# Patient Record
Sex: Male | Born: 1985 | Race: White | Hispanic: No | Marital: Married | Smoking: Current every day smoker
Health system: Southern US, Community
[De-identification: ages and names within clinical notes are randomized; demographics above are authoritative.]

## PROBLEM LIST (undated history)

## (undated) DIAGNOSIS — F32A Depression, unspecified: Secondary | ICD-10-CM

## (undated) DIAGNOSIS — F329 Major depressive disorder, single episode, unspecified: Secondary | ICD-10-CM

## (undated) DIAGNOSIS — R7989 Other specified abnormal findings of blood chemistry: Secondary | ICD-10-CM

## (undated) HISTORY — DX: Depression, unspecified: F32.A

## (undated) HISTORY — DX: Other specified abnormal findings of blood chemistry: R79.89

## (undated) HISTORY — DX: Major depressive disorder, single episode, unspecified: F32.9

## (undated) HISTORY — PX: TONSILLECTOMY AND ADENOIDECTOMY: SUR1326

---
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
## 2005-07-28 ENCOUNTER — Emergency Department: Admit: 2005-07-28 | Payer: Self-pay | Source: Emergency Department | Admitting: Emergency Medicine

## 2005-11-04 HISTORY — PX: TONSILLECTOMY: SUR1361

## 2005-11-25 ENCOUNTER — Ambulatory Visit
Admission: RE | Admit: 2005-11-25 | Disposition: A | Discharge status: Home / Self Care | Payer: Self-pay | Source: Ambulatory Visit

## 2007-07-22 ENCOUNTER — Emergency Department
Admission: EM | Admit: 2007-07-22 | Disposition: A | Discharge status: Home / Self Care | Payer: Self-pay | Source: Emergency Department | Admitting: Emergency Medicine

## 2007-08-12 ENCOUNTER — Ambulatory Visit
Admission: RE | Admit: 2007-08-12 | Disposition: A | Discharge status: Home / Self Care | Payer: Self-pay | Source: Ambulatory Visit | Admitting: Family

## 2008-03-12 ENCOUNTER — Emergency Department
Admission: EM | Admit: 2008-03-12 | Disposition: A | Discharge status: Other Acute Inpatient Hospital | Payer: Self-pay | Source: Emergency Department | Admitting: Emergency Medicine

## 2008-03-13 ENCOUNTER — Inpatient Hospital Stay
Admission: EM | Admit: 2008-03-13 | Disposition: A | Discharge status: Home / Self Care | Payer: Self-pay | Source: Emergency Department | Admitting: Otolaryngology

## 2008-03-13 LAB — MAGNESIUM: Magnesium: 1.6 mg/dL (ref 1.6–2.3)

## 2008-03-13 LAB — BASIC METABOLIC PANEL
BUN: 16 mg/dL (ref 8–20)
BUN: 19 mg/dL (ref 8–20)
CO2: 22 mEq/L (ref 21–30)
CO2: 24 mEq/L (ref 21–30)
Calcium: 8.3 mg/dL — ABNORMAL LOW (ref 8.6–10.2)
Calcium: 9 mg/dL (ref 8.6–10.2)
Chloride: 107 mEq/L (ref 98–107)
Chloride: 108 mEq/L — ABNORMAL HIGH (ref 98–107)
Creatinine: 0.9 mg/dL (ref 0.6–1.5)
Creatinine: 0.9 mg/dL (ref 0.6–1.5)
Glucose: 123 mg/dL — ABNORMAL HIGH (ref 70–100)
Glucose: 96 mg/dL (ref 70–100)
Potassium: 4.4 mEq/L (ref 3.6–5.0)
Potassium: 5.3 mEq/L — ABNORMAL HIGH (ref 3.6–5.0)
Sodium: 138 mEq/L (ref 136–146)
Sodium: 139 mEq/L (ref 136–146)

## 2008-03-13 LAB — CBC AND DIFFERENTIAL
Basophils Absolute: 0 /mm3 (ref 0.0–0.2)
Basophils: 0 % (ref 0–2)
Eosinophils Absolute: 0.2 /mm3 (ref 0.0–0.7)
Eosinophils: 1 % (ref 0–5)
Granulocytes Absolute: 12.2 /mm3 — ABNORMAL HIGH (ref 1.8–8.1)
Hematocrit: 36 % — ABNORMAL LOW (ref 42.0–52.0)
Hgb: 12.5 G/DL — ABNORMAL LOW (ref 13.0–17.0)
Immature Granulocytes Absolute: 0.1
Immature Granulocytes: 0 %
Lymphocytes Absolute: 3.2 /mm3 (ref 0.5–4.4)
Lymphocytes: 19 % (ref 15–41)
MCH: 30.4 PG (ref 28.0–32.0)
MCHC: 34.7 G/DL (ref 32.0–36.0)
MCV: 87.6 FL (ref 80.0–100.0)
MPV: 9.2 FL — ABNORMAL LOW (ref 9.4–12.3)
Monocytes Absolute: 1.6 /mm3 — ABNORMAL HIGH (ref 0.0–1.2)
Monocytes: 9 % (ref 0–11)
Neutrophils %: 71 % (ref 52–75)
Platelets: 324 /mm3 (ref 140–400)
RBC: 4.11 /mm3 — ABNORMAL LOW (ref 4.70–6.00)
RDW: 12.5 % (ref 11.5–15.0)
WBC: 17.25 /mm3 — ABNORMAL HIGH (ref 3.50–10.80)

## 2008-03-13 LAB — CBC
Hematocrit: 31.9 % — ABNORMAL LOW (ref 42.0–52.0)
Hgb: 10.9 G/DL — ABNORMAL LOW (ref 13.0–17.0)
MCH: 28.8 PG (ref 28.0–32.0)
MCHC: 34.2 G/DL (ref 32.0–36.0)
MCV: 84.2 FL (ref 80.0–100.0)
MPV: 8.9 FL — ABNORMAL LOW (ref 9.4–12.3)
Platelets: 246 /mm3 (ref 140–400)
RBC: 3.79 /mm3 — ABNORMAL LOW (ref 4.70–6.00)
RDW: 17.4 % — ABNORMAL HIGH (ref 11.5–15.0)
WBC: 10.97 /mm3 — ABNORMAL HIGH (ref 3.50–10.80)

## 2008-03-13 LAB — TYPE AND SCREEN
AB Screen Gel: NEGATIVE
ABO Rh: O POS

## 2008-03-13 LAB — GFR

## 2008-03-13 LAB — PHOSPHORUS: Phosphorus: 2.2 mg/dL — ABNORMAL LOW (ref 2.5–4.5)

## 2008-03-14 LAB — BASIC METABOLIC PANEL
BUN / Creatinine Ratio: 13 (ref 8–20)
BUN: 13 mg/dL (ref 6–20)
CO2: 29 mmol/L (ref 21.0–31.0)
Calcium: 9.5 mg/dL (ref 8.4–10.2)
Chloride: 101 mmol/L (ref 101–111)
Creatinine: 1.02 mg/dL (ref 0.5–1.4)
EGFR: >60 mL/min/{1.73_m2}
EGFR: >60 mL/min/{1.73_m2}
Glucose: 102 mg/dL — ABNORMAL HIGH (ref 70–100)
Potassium: 4.5 mmol/L (ref 3.6–5.0)
Sodium: 143 mmol/L (ref 135–145)

## 2008-03-14 LAB — ^CBC WITH DIFF MCKESSON
Hematocrit: 45.2 % (ref 39.0–49.0)
Hemoglobin: 15.7 g/dL (ref 13.2–17.3)
MCH: 30.4 pg (ref 27.0–34.0)
MCHC: 34.8 % (ref 32.0–36.0)
MCV: 87.5 fL (ref 80–100)
Platelets: 433 10*3/uL — ABNORMAL HIGH (ref 150–400)
RBC: 5.17 /mm3 (ref 3.80–5.40)
RDW: 12.1 % (ref 11.0–14.0)
WBC: 15.5 10*3/uL — ABNORMAL HIGH (ref 4.8–10.8)

## 2008-03-14 LAB — PTT (PARTIAL THROMBOPLASTIN TIME)
PTT Ratio: 1.1 (ref 0.8–1.2)
PTT: 31.4 s (ref 21.6–34.0)

## 2008-03-14 LAB — PT (PROTHROMBIN TIME)
PT INR: 1.2
PT: 13.1 s — ABNORMAL HIGH (ref 10.6–12.8)

## 2008-03-14 LAB — ^MANUAL DIFFERENTIAL MCKESSON
CELLS COUNTED: 100
Eosinophils Manual: 1 % (ref 0–6)
Lymphocytes Manual: 29 % (ref 25–55)
Monocytes: 10 % — ABNORMAL HIGH (ref 1–8)
Platelet Evaluation: NORMAL
RBC Morphology: NORMAL
Segmented Neutrophils: 60 % (ref 49–69)

## 2008-03-14 LAB — ABO/RH: ABO Rh: O POS

## 2008-03-14 LAB — ANTIBODY SCREEN: AB Screen Gel: NEGATIVE

## 2010-09-20 ENCOUNTER — Emergency Department: Payer: Self-pay | Admitting: Emergency Medicine

## 2010-10-10 ENCOUNTER — Emergency Department: Payer: Self-pay | Admitting: Emergency Medicine

## 2010-11-04 HISTORY — PX: ADENOIDECTOMY: SUR15

## 2011-03-11 ENCOUNTER — Emergency Department
Admission: EM | Admit: 2011-03-11 | Disposition: A | Discharge status: Home / Self Care | Payer: Self-pay | Source: Emergency Department | Admitting: Emergency Medicine

## 2011-08-23 NOTE — Op Note (Unsigned)
DATE OF BIRTH:                        13-May-1986      ADMISSION DATE:                     03/13/2008            PATIENT LOCATION:                    [id number]            DATE OF PROCEDURE:                   03/13/2008      SURGEON:                            Pauline Aus, MD      ASSISTANT(S):                  PREOPERATIVE DIAGNOSIS:  POSTOPERATIVE TONSILLECTOMY BLEEDING.            POSTOPERATIVE DIAGNOSIS:  POSTOPERATIVE TONSILLECTOMY BLEEDING, RIGHT      INFERIOR TONSILLAR POLE.            PROCEDURE:  EMERGENCY EXAMINATION UNDER ANESTHESIA WITH CONTROL OF      BLEEDING, RIGHT TONSILLAR FOSSA, INFERIOR POLE.            ANESTHESIA:  General endotracheal.            INDICATIONS FOR PROCEDURE:  This is a 25 year old male with a tonsillectomy      performed by another operator within the past 24 hours.  The patient was      discharged home after uneventful surgery and did well until today when he      increased activity.  After eating a hamburger and [place] fries from      [employer], he began to have profuse intermittent bleeding from the mouth.      He was seen at the [facility] and they were unable to      obtain stability.  The patient was transferred urgently to [facility] Room for urgent ENT consultation and examination under      anesthesia.            DESCRIPTION OF PROCEDURE:  The patient was placed supine on the operating      table and intubated uneventfully in his sleep with active bleeding.  A      shoulder roll was placed beneath the shoulders.  A head drape was placed      over the head.  A Crowe-Davis mouth gag was placed in the oropharynx and      suspended from a Mayo stand. Blood was noted to be emanating up into the      oropharynx and out of the mouth.  After adequate positioning of the mouth      gag and suctioning, the bleeding spontaneously ceased.            Following this an 18-gauge [place] sump catheter was delicately passed into      the stomach  and a large amount of old and fresh blood was suctioned.  The      nasogastric tube was gently removed and a bulb syringe was used to gently      irrigate the pharynx. Both  tonsillar fossae were noted to be dry.  Both      tonsillar pillars, anterior and posterior, were preserved during his prior      surgery.            Following this a red rubber catheter was placed via the right nasal passage      and grasped with a Kelly clamp.  With the aid of a laryngeal mirror, the      nasopharynx was examined.  Persistent adenoid tissue was noted.  There was      no evidence of surgery the previous day and no active bleeding sites.            The oropharynx was once again examined gently.  The right      posterior-inferior fossae at the base of the tongue was examined and      bleeding commenced once again with free flowing blood at a high rate.      Using the Bovie suction cautery with meticulous cautery, the bleeder was      cauterized.  The area was then compressed with a tonsil sponge for      approximately 5 minutes' duration and then reexamined.  Very small areas in      the same region were cauterized and no active bleeding was noted.  The      patient was observed for approximately 10 minutes more without any active      bleeding.            The red rubber catheter was removed.  The pharynx was gently cleaned.  Then      the left fossa was examined as well as both superior poles.  No other      additional bleeding site was noted.  The procedure was terminated.  The      patient was allowed to awaken slowly.  A nasal airway was placed via the      left nasal passage to aid in his wakeup and maintenance of airway.  The      patient was extubated uneventfully and transferred to the recovery room in      satisfactory condition immediately postoperative.  Due to the large amount      of blood loss and difficulty with blood pressure, it was felt prudent to      electively monitor him in the intermediate care setting over  the next 12      hours.                                          ___________________________________          Date Signed: __________      Pauline Aus, MD  ([id number])            D: 03/13/2008 by Pauline Aus, MD      T: 03/13/2008 by [id number] ([id number]) ([id number])      cc:  Pauline Aus, MD

## 2011-12-24 ENCOUNTER — Emergency Department: Payer: Self-pay | Admitting: *Deleted

## 2013-07-27 ENCOUNTER — Emergency Department: Payer: Self-pay | Admitting: Emergency Medicine

## 2013-07-27 LAB — COMPREHENSIVE METABOLIC PANEL
Albumin: 4.4 g/dL (ref 3.4–5.0)
Alkaline Phosphatase: 106 U/L (ref 50–136)
BUN: 17 mg/dL (ref 7–18)
Bilirubin,Total: 0.4 mg/dL (ref 0.2–1.0)
Calcium, Total: 9.2 mg/dL (ref 8.5–10.1)
Chloride: 104 mmol/L (ref 98–107)
Co2: 29 mmol/L (ref 21–32)
EGFR (African American): >60
Glucose: 113 mg/dL — ABNORMAL HIGH (ref 65–99)
Potassium: 4.3 mmol/L (ref 3.5–5.1)
SGOT(AST): 29 U/L (ref 15–37)
Total Protein: 7.9 g/dL (ref 6.4–8.2)

## 2013-07-27 LAB — URINALYSIS, COMPLETE
Blood: NEGATIVE
Glucose,UR: NEGATIVE mg/dL (ref 0–75)
Leukocyte Esterase: NEGATIVE
Nitrite: NEGATIVE
Protein: NEGATIVE
RBC,UR: 3 /HPF (ref 0–5)
Specific Gravity: 1.026 (ref 1.003–1.030)
Squamous Epithelial: <1
WBC UR: 1 /HPF (ref 0–5)

## 2013-07-27 LAB — DRUG SCREEN, URINE
Amphetamines, Ur Screen: NEGATIVE (ref ?–1000)
Benzodiazepine, Ur Scrn: NEGATIVE (ref ?–200)
Methadone, Ur Screen: NEGATIVE (ref ?–300)
Tricyclic, Ur Screen: NEGATIVE (ref ?–1000)

## 2013-07-27 LAB — ETHANOL
Ethanol %: <0.003 % (ref 0.000–0.080)
Ethanol: <3 mg/dL

## 2013-07-27 LAB — CBC
HCT: 45.5 % (ref 40.0–52.0)
HGB: 16 g/dL (ref 13.0–18.0)
MCH: 30.8 pg (ref 26.0–34.0)
MCHC: 35.1 g/dL (ref 32.0–36.0)
Platelet: 341 10*3/uL (ref 150–440)
RBC: 5.19 10*6/uL (ref 4.40–5.90)
RDW: 12.9 % (ref 11.5–14.5)
WBC: 7.9 10*3/uL (ref 3.8–10.6)

## 2013-07-27 LAB — TSH: Thyroid Stimulating Horm: 0.58 u[IU]/mL

## 2013-07-27 LAB — ACETAMINOPHEN LEVEL: Acetaminophen: <2 ug/mL

## 2013-11-04 ENCOUNTER — Emergency Department: Payer: Self-pay | Admitting: Emergency Medicine

## 2015-10-30 ENCOUNTER — Ambulatory Visit (INDEPENDENT_AMBULATORY_CARE_PROVIDER_SITE_OTHER): Payer: Commercial Managed Care - POS | Admitting: Family

## 2015-10-30 ENCOUNTER — Encounter (INDEPENDENT_AMBULATORY_CARE_PROVIDER_SITE_OTHER): Payer: Self-pay | Admitting: Family

## 2015-10-30 VITALS — BP 129/77 | HR 85 | Temp 98.2°F | Resp 16 | Ht 72.0 in | Wt 199.0 lb

## 2015-10-30 DIAGNOSIS — H6692 Otitis media, unspecified, left ear: Secondary | ICD-10-CM

## 2015-10-30 LAB — POCT RAPID STREP A: Rapid Strep A Screen POCT: NEGATIVE

## 2015-10-30 MED ORDER — AZITHROMYCIN 250 MG PO TABS
ORAL_TABLET | ORAL | Status: AC
Start: 2015-10-30 — End: 2015-11-04

## 2015-10-30 NOTE — Progress Notes (Signed)
[facility]    PROGRESS NOTE      Patient: Devin Salazar   Date: 10/30/2015   MRN: [id number]     Past Medical History   Diagnosis Date   . Depression      Social History     Social History   . Marital Status: Single     Spouse Name: N/A   . Number of Children: N/A   . Years of Education: N/A     Occupational History   . Not on file.     Social History Main Topics   . Smoking status: Current Every Day Smoker   . Smokeless tobacco: Never Used   . Alcohol Use: No   . Drug Use: No   . Sexual Activity: Not on file     Other Topics Concern   . Not on file     Social History Narrative   . No narrative on file     No family history on file.    ASSESSMENT/PLAN     Devin Salazar is a 29 y.o. male    Chief Complaint   Patient presents with   . Otalgia        1. Acute ear infection, left  - POCT Rapid Group A Strep  - azithromycin (ZITHROMAX) 250 MG tablet; Two tabs PO day 1 and then one tab PO daily days 2-5  Dispense: 6 tablet; Refill: 0  Increase fluid intake. Daily vitamin C. Rest. Ibuprofen or Tylenol as needed for discomfort as well as warm compresses to affected ear can  be comforting. Use of q-tips for cleaning the ear is discouraged. Reviewed medication use and side effects with patient, please complete entire course of antibiotics, take probiotics or eat yogurt while on medication. Signs and symptoms reviewed with patient that warrants RTO. Patient agreeable to plan.        Results for orders placed or performed during the hospital encounter of 03/13/08   CBC and differential   Result Value Ref Range    WBC 17.25 (H) 3.50 - 10.80 /CUMM    RBC 4.11 (L) 4.70 - 6.00 /CUMM    Hgb 12.5 (L) 13.0 - 17.0 G/DL    Hematocrit 53.6 (L) 42.0 - 52.0 %    MCV 87.6 80.0 - 100.0 FL    MCH 30.4 28.0 - 32.0 PG    MCHC 34.7 32.0 - 36.0 G/DL    RDW 64.4 03.4 - 74.2 %    Platelets 324 140 - 400 /CUMM    MPV 9.2 (L) 9.4 - 12.3 FL    SEGS % 71 52 - 75 %    Lymphocytes 19 15 - 41 %    Monocytes 9 0 - 11 %    Eosinophils 1 0 - 5  %    Basophils 0 0 - 2 %    Immature Granulocyte 0 %    GRANS Absolute 12.2 (H) 1.8 - 8.1 /CUMM    Lymphocytes Absolute 3.2 0.5 - 4.4 /CUMM    MONO Absolute 1.6 (H) 0.0 - 1.2 /CUMM    Eosinophils Absolute 0.2 0.0 - 0.7 /CUMM    BASO Absolute 0.0 0.0 - 0.2 /CUMM    Immature Gran Absolute 0.1    Basic Metabolic Panel   Result Value Ref Range    Glucose 96 70 - 100 mg/dL    BUN 16 8 - 20 mg/dL    Creatinine 0.9 0.6 - 1.5 mg/dL    Sodium  139 136 - 146 mEq/L    Potassium 4.4 3.6 - 5.0 mEq/L    Chloride 107 98 - 107 mEq/L    CO2 22 21 - 30 mEq/L    Calcium 8.3 (L) 8.6 - 10.2 mg/dL   GFR   Result Value Ref Range    EGFR * mg/dl   Basic Metabolic Panel   Result Value Ref Range    Glucose 123 (H) 70 - 100 mg/dL    BUN 19 8 - 20 mg/dL    Creatinine 0.9 0.6 - 1.5 mg/dL    Sodium 782 956 - 213 mEq/L    Potassium 5.3 (H) 3.6 - 5.0 mEq/L    Chloride 108 (H) 98 - 107 mEq/L    CO2 24 21 - 30 mEq/L    Calcium 9.0 8.6 - 10.2 mg/dL   Magnesium   Result Value Ref Range    Magnesium 1.6 1.6 - 2.3 mg/dL   Phosphorus   Result Value Ref Range    Phosphorus 2.2 (L) 2.5 - 4.5 mg/dL   CBC without differential   Result Value Ref Range    WBC 10.97 (H) 3.50 - 10.80 /CUMM    RBC 3.79 (L) 4.70 - 6.00 /CUMM    Hgb 10.9 (L) 13.0 - 17.0 G/DL    Hematocrit 08.6 (L) 42.0 - 52.0 %    MCV 84.2 80.0 - 100.0 FL    MCH 28.8 28.0 - 32.0 PG    MCHC 34.2 32.0 - 36.0 G/DL    RDW 57.8 (H) 46.9 - 15.0 %    Platelets 246 140 - 400 /CUMM    MPV 8.9 (L) 9.4 - 12.3 FL   Type and Screen   Result Value Ref Range    ABO Rh OPOS     Specimen Expiration [id number]     AB Screen Gel NEGATIVE        Risk & Benefits of the new medication(s) were explained to the patient (and family) who verbalized understanding & agreed to the treatment plan. Patient (family) encouraged to contact me/clinical staff with any questions/concerns      MEDICATIONS     Current Outpatient Prescriptions   Medication Sig Dispense Refill   . buPROPion SR (WELLBUTRIN SR) 150 MG 12 hr tablet Take 150 mg  by mouth daily.     Marland Kitchen azithromycin (ZITHROMAX) 250 MG tablet Two tabs PO day 1 and then one tab PO daily days 2-5 6 tablet 0     No current facility-administered medications for this visit.       No Known Allergies    SUBJECTIVE     Chief Complaint   Patient presents with   . Otalgia        Otalgia   There is pain in the right ear. This is a new problem. The current episode started yesterday. The problem occurs constantly. The problem has been rapidly worsening. There has been no fever. The pain is at a severity of 8/10. The pain is severe. Associated symptoms include coughing, headaches, hearing loss and a sore throat. Pertinent negatives include no abdominal pain, diarrhea, ear discharge, neck pain, rash or vomiting. 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 The treatment provided no relief. His past medical history is significant for a chronic ear infection. There is no history of hearing loss or a tympanostomy tube.       ROS     Review of Systems   Constitutional: Negative for fever, chills and fatigue.   HENT: Positive for ear  pain, hearing loss, sinus pressure and sore throat. Negative for ear discharge.    Respiratory: Positive for cough.    Cardiovascular: Negative for chest pain.   Gastrointestinal: Negative for vomiting, abdominal pain and diarrhea.   Musculoskeletal: Negative for back pain and neck pain.   Skin: Negative for rash.   Neurological: Positive for headaches.       The following portions of the patient's history were reviewed and updated as appropriate: Allergies, Current Medications, Past Family History, Past Medical history, Past social history, Past surgical history, and Problem List.    PHYSICAL EXAM     Filed Vitals:    10/30/15 1340   BP: 129/77   Pulse: 85   Temp: 98.2 F (36.8 C)   TempSrc: Oral   Resp: 16   Height: 1.829 m (6')   Weight: 90.266 kg (199 lb)   SpO2: 96%       Physical Exam   Nursing note and vitals reviewed.  Constitutional: He is oriented to person, place, and time. He appears  well-developed and well-nourished.   HENT:   Head: Normocephalic.   Right Ear: Tympanic membrane is erythematous and bulging.   Eyes: Pupils are equal, round, and reactive to light. Right eye exhibits no discharge. Left eye exhibits no discharge.   Neck: Normal range of motion. Neck supple. No JVD present. No tracheal deviation present. No thyromegaly present.   Cardiovascular: Normal rate, regular rhythm and normal heart sounds.  Exam reveals no gallop and no friction rub.    No murmur heard.  Pulmonary/Chest: Effort normal and breath sounds normal. No stridor.   Abdominal: Soft. Bowel sounds are normal. There is no tenderness.   Musculoskeletal: Normal range of motion. He exhibits no edema.   Neurological: He is alert and oriented to person, place, and time. He has normal reflexes.   Skin: Skin is warm and dry.   Psychiatric: He has a normal mood and affect. His behavior is normal. Thought content normal.     Ortho Exam  Neurologic Exam     Mental Status   Oriented to person, place, and time.     Cranial Nerves     CN III, IV, VI   Pupils are equal, round, and reactive to light.      PROCEDURE(S)     Procedures        Signed,  Harvel Ricks, NP  10/30/2015

## 2016-01-25 ENCOUNTER — Encounter: Payer: Self-pay | Admitting: *Deleted

## 2016-01-29 ENCOUNTER — Encounter: Payer: Self-pay | Admitting: Urology

## 2016-01-29 ENCOUNTER — Ambulatory Visit (INDEPENDENT_AMBULATORY_CARE_PROVIDER_SITE_OTHER): Payer: Managed Care, Other (non HMO) | Admitting: Urology

## 2016-01-29 VITALS — BP 118/70 | HR 92 | Ht 72.0 in | Wt 210.6 lb

## 2016-01-29 DIAGNOSIS — E291 Testicular hypofunction: Secondary | ICD-10-CM

## 2016-01-29 DIAGNOSIS — M545 Low back pain, unspecified: Secondary | ICD-10-CM

## 2016-01-29 DIAGNOSIS — R7989 Other specified abnormal findings of blood chemistry: Secondary | ICD-10-CM

## 2016-01-29 DIAGNOSIS — Z309 Encounter for contraceptive management, unspecified: Secondary | ICD-10-CM

## 2016-01-29 DIAGNOSIS — Z3009 Encounter for other general counseling and advice on contraception: Secondary | ICD-10-CM

## 2016-01-29 MED ORDER — DIAZEPAM 10 MG PO TABS: ORAL_TABLET | ORAL | Status: DC

## 2016-01-29 NOTE — Progress Notes (Signed)
01/29/2016 4:28 PM   [name] 11/14/1985 [id number]  Referring provider: No referring provider defined for this encounter.  Chief Complaint  Patient presents with  . Low Testosterone    referred by [facility]  . VAS Consult    HPI: Patient is a 30 year old Caucasian male with possible hypogonadism who presents today as a referral from Rexanne Mano, NP.  His wife is also requesting a vasectomy.  Patient has been experiencing fatigue for a number of weeks.  More recently, he is a pressure in his right testicle. He had been treated for epididymitis a few years ago and he is concerned that his testicle was damaged.  He is fearful that if the testicle was damaged, he would have low testosterone and this will be contributing to his fatigue.  He also has been having lower back pain for the last month associated with joint pain.  He addressed this with his primary care provider and she obtained two testosterone levels.  On 11/24/2015, it was 265 ng/dL and on 40/98/1191 it was 371.4 ng/dL.    Patient is experiencing a decrease in libido, a lack of energy, a decrease in strength, a decreased enjoyment in life, sadness and/or grumpiness, a recent deterioration in an ability to play sports and a recent deterioration in their work performance.  This is indicated by his responses to the ADAM questionnaire.      Androgen Deficiency in the Aging Male      01/29/16 1600       Androgen Deficiency in the Aging Male   Do you have a decrease in libido (sex drive) Yes     Do you have lack of energy Yes     Do you have a decrease in strength and/or endurance Yes     Have you lost height No     Have you noticed a decreased "enjoyment of life" Yes     Are you sad and/or grumpy Yes     Are your erections less strong No     Have you noticed a recent deterioration in your ability to play sports Yes     Are you falling asleep after dinner No     Has there been a recent deterioration in your  work performance Yes       He is experiencing baseline urinary symptoms of frequent urination and nocturia. His I PSS score is 7/2.       IPSS      01/29/16 1600       International Prostate Symptom Score   How often have you had the sensation of not emptying your bladder? Less than half the time     How often have you had to urinate less than every two hours? Less than half the time     How often have you found you stopped and started again several times when you urinated? Not at All     How often have you found it difficult to postpone urination? Not at All     How often have you had a weak urinary stream? Not at All     How often have you had to strain to start urination? Not at All     How many times did you typically get up at night to urinate? 3 Times     Total IPSS Score 7     Quality of Life due to urinary symptoms   If you were to spend the rest of your life with  your urinary condition just the way it is now how would you feel about that? Mostly Satisfied        Score:  1-7 Mild 8-19 Moderate 20-35 Severe  His not complaining of erectile dysfunction at this time. His SHIM score is 25.      SHIM      01/29/16 1611       SHIM: Over the last 6 months:   How do you rate your confidence that you could get and keep an erection? Very High     When you had erections with sexual stimulation, how often were your erections hard enough for penetration (entering your partner)? Almost Always or Always     During sexual intercourse, how often were you able to maintain your erection after you had penetrated (entered) your partner? Not Difficult     During sexual intercourse, how difficult was it to maintain your erection to completion of intercourse? Not Difficult     When you attempted sexual intercourse, how often was it satisfactory for you? Not Difficult     SHIM Total Score   SHIM 25        Patient's wife is also requesting that the patient undergo vasectomy.  The patient  did not realize that this was an appointment for a vasectomy consult as well.  He and his wife argued momentarily and she left the office.  They had a heated argument over the phone, per staff.  He is now decided to undergo vasectomy.  Patient has 2 children and they are expecting their third.  Patient has a history of epididymitis, but he states he has not had chronic groin pain.    Today, we discussed what the vas deferens is, where it is located, and its function. We reviewed the procedure for vasectomy, it's risks, benefits, alternatives, and likelihood of achieving his goals.   We discussed in detail the procedure, complications, and recovery as well as the need for clearance prior to unprotected intercourse. We discussed that vasectomy does not protect against sexually transmitted diseases. We discussed that this procedure does not result in immediate sterility and that they would need to use other forms of birth control until he has been cleared with a three month negative postvasectomy semen analyses.  I explained that the procedure is considered to be permanent and that attempts at reversal have varying degrees of success. These options include vasectomy reversal, sperm retrieval, and in vitro fertilization; these can be very expensive.   We discussed the chance of postvasectomy pain syndrome which occurs in less than 5% of patients. I explained to the patient that there is no treatment to resolve this chronic pain, and that if it developed I would not be able to help resolve the issue, but that surgery is generally not needed for correction.   I explained there have even been reports of systemic like illness associated with this chronic pain, and that there was no good cure. I explained that vasectomy it is not a 100% reliable form of birth control, and the risk of pregnancy after vasectomy is approximately 1 in 2000 men who had a negative postvasectomy semen analysis or rare non-motile  sperm.  I explained that repeat vasectomy was necessary in less than 1% of vasectomy procedures when employing the type of technique that is performed in the office. I explained that he should refrain from ejaculation for approximately one week following vasectomy. I explained that there are other options for birth control which are  permanent and non-permanent; we discussed these.  I explained the rates of surgical complications, such as symptomatic hematoma or infection, are low (1-2%) and vary with the surgeon's experience and criteria used to diagnose the complication.   PMH: Past Medical History  Diagnosis Date  . Depression   . Low testosterone     Surgical History: Past Surgical History  Procedure Laterality Date  . Tonsillectomy  2007  . Adenoidectomy  2012    Home Medications:    Medication List       This list is accurate as of: 01/29/16  4:28 PM.  Always use your most recent med list.               azithromycin 250 MG tablet  Commonly known as:  ZITHROMAX  Reported on 01/29/2016     buPROPion 150 MG 24 hr tablet  Commonly known as:  WELLBUTRIN XL  Take by mouth.     cefdinir 300 MG capsule  Commonly known as:  OMNICEF  Reported on 01/29/2016     cholecalciferol 1000 units tablet  Commonly known as:  VITAMIN D  Take 1,000 Units by mouth daily.     Cranberry 250 MG Tabs  Take by mouth.     diazepam 10 MG tablet  Commonly known as:  VALIUM  Take 30 minutes prior to the vasectomy     Melatonin 10 MG Caps  Take by mouth.     multivitamin tablet  Take 1 tablet by mouth daily.     predniSONE 10 MG tablet  Commonly known as:  DELTASONE  Reported on 01/29/2016        Allergies:  Allergies  Allergen Reactions  . Amoxicillin Rash    Family History: Family History  Problem Relation Age of Onset  . Kidney disease Neg Hx   . Prostate cancer Neg Hx   . Colon cancer Paternal Grandfather   . Pancreatic cancer Paternal Grandfather   . Esophageal  cancer Maternal Grandfather   . Heart disease Paternal Grandfather   . Heart disease Maternal Grandfather   . Huntington's disease Paternal Grandmother     Social History:  reports that he has quit smoking. He does not have any smokeless tobacco history on file. He reports that he does not drink alcohol or use illicit drugs.  ROS: UROLOGY Frequent Urination?: Yes Hard to postpone urination?: No Burning/pain with urination?: No Get up at night to urinate?: Yes Leakage of urine?: No Urine stream starts and stops?: No Trouble starting stream?: No Do you have to strain to urinate?: No Blood in urine?: No Urinary tract infection?: No Sexually transmitted disease?: No Injury to kidneys or bladder?: No Painful intercourse?: No Weak stream?: No Erection problems?: No Penile pain?: No  Gastrointestinal Nausea?: Yes Vomiting?: No Indigestion/heartburn?: No Diarrhea?: No Constipation?: Yes  Constitutional Fever: No Night sweats?: No Weight loss?: No Fatigue?: Yes  Skin Skin rash/lesions?: No Itching?: No  Eyes Blurred vision?: No Double vision?: No  Ears/Nose/Throat Sore throat?: Yes Sinus problems?: Yes  Hematologic/Lymphatic Swollen glands?: Yes Easy bruising?: No  Cardiovascular Leg swelling?: No Chest pain?: Yes  Respiratory Cough?: No Shortness of breath?: Yes  Endocrine Excessive thirst?: No  Musculoskeletal Back pain?: Yes Joint pain?: Yes  Neurological Headaches?: Yes Dizziness?: No  Psychologic Depression?: Yes Anxiety?: No  Physical Exam: BP 118/70 mmHg  Pulse 92  Ht 6' (1.829 m)  Wt 210 lb 9.6 oz (95.528 kg)  BMI 28.56 kg/m2  Constitutional: Well nourished. Alert and oriented,  No acute distress. HEENT: [place] AT, moist mucus membranes. Trachea midline, no masses. Cardiovascular: No clubbing, cyanosis, or edema. Respiratory: Normal respiratory effort, no increased work of breathing. GI: Abdomen is soft, non tender, non distended,  no abdominal masses. Liver and spleen not palpable.  No hernias appreciated.  Stool sample for occult testing is not indicated.   GU: No CVA tenderness.  No bladder fullness or masses.  Patient with circumcised phallus.   Urethral meatus is patent.  No penile discharge. No penile lesions or rashes. Scrotum without lesions, cysts, rashes and/or edema.  Testicles are located scrotally bilaterally. No masses are appreciated in the testicles. Left and right epididymis are normal. Rectal: Patient with  normal sphincter tone. Anus and perineum without scarring or rashes. No rectal masses are appreciated. Prostate is approximately 40 grams, no nodules are appreciated. Seminal vesicles are normal. Skin: No rashes, bruises or suspicious lesions. Lymph: No cervical or inguinal adenopathy. Neurologic: Grossly intact, no focal deficits, moving all 4 extremities. Psychiatric: Normal mood and affect.  Laboratory Data: Lab Results  Component Value Date   WBC 7.9 07/27/2013   HGB 16.0 07/27/2013   HCT 45.5 07/27/2013   MCV 88 07/27/2013   PLT 341 07/27/2013    Lab Results  Component Value Date   CREATININE 1.09 07/27/2013    Lab Results  Component Value Date   TSH 0.58 07/27/2013    Lab Results  Component Value Date   AST 29 07/27/2013   Lab Results  Component Value Date   ALT 33 07/27/2013       Assessment & Plan:   1. Low testosterone level:   I explained to patient that the current recommendations from the Endocrine Society reports the diagnosis of hypogonadism requires a serum total testosterone level obtained between 8 and 10 AM at least 2 days apart that is below the laboratory parameters  for normal testosterone.  Our office obtains testosterone levels before 9 AM as we have found some insurance for these requests this time.    At this time, the patient does not meet this requirement.  He will return in the morning before 9 AM for his first serum testosterone draw.  If it returns  low, he will have another level repeated in two days.  If it returns within normal limits, he is not a candidate for testosterone therapy as he is not hypogonadal.    I briefly discussed with the patient the side effects of testosterone therapy, such as: enlargement of the prostate gland that may in turn cause LUTS, possible increased risk of PCa, DVT's and/or PE's, possible increased risk of heart attack or stroke, lower sperm count, swelling of the ankles, feet, or body, with or without heart failure, enlarged or painful breasts, have problems breathing while you sleep (sleep apnea), increased prostate specific antigen, mood swings, hypertension and increased red blood cell count.   2. Low back pain:   Patient is complaining of lower back pain associated with joint pain.  This may be the result of his current occupation, but there may be an autoimmune component.  He is not found to be hypogonadal,  I suggest further evaluation with his PCP.   3. Vasectomy consult:  Patient has read and signed the consent.  He is given the pre-op vasectomy instruction sheet.  He is prescribed Valium 10 mg and instructed to take it 30 minutes prior to his vasectomy appointment.  He is to have a driver.  I reemphasized to the patient  that this is to be considered a permanent form of birth control, that he is to use an alternative form of birth control until we receive the 3 months specimen and it is cleared of sperm and that this will not prevent STI's.  His questions are answered to his satisfaction and he understands the risks and is willing to proceed with the vasectomy.  He will schedule his vasectomy.  I emphasized again that the procedure is considered to be permanent and that attempts at reversal have varying degrees of success. These options include vasectomy reversal, sperm retrieval, and in vitro fertilization; these can be very expensive.   He stated his understanding of this fact.   Return for patient will  return in the am before 9AM for testosterone blood draw.  These notes generated with voice recognition software. I apologize for typographical errors.  Michiel Cowboy, PA-C  [facility] [address] [place], [place] [address] [phone]

## 2016-01-30 ENCOUNTER — Other Ambulatory Visit: Payer: Self-pay

## 2016-01-30 DIAGNOSIS — Z79899 Other long term (current) drug therapy: Secondary | ICD-10-CM

## 2016-01-31 ENCOUNTER — Other Ambulatory Visit: Payer: Managed Care, Other (non HMO)

## 2016-01-31 DIAGNOSIS — Z79899 Other long term (current) drug therapy: Secondary | ICD-10-CM

## 2016-02-01 ENCOUNTER — Other Ambulatory Visit: Payer: Self-pay

## 2016-02-01 ENCOUNTER — Telehealth: Payer: Self-pay

## 2016-02-01 DIAGNOSIS — E291 Testicular hypofunction: Secondary | ICD-10-CM

## 2016-02-01 LAB — TESTOSTERONE: TESTOSTERONE: 466 ng/dL (ref 348–1197)

## 2016-02-01 NOTE — Telephone Encounter (Signed)
LMOM

## 2016-02-01 NOTE — Telephone Encounter (Signed)
-----   Message from Tyler BattiestShannon A McGowan, PA-C sent at 02/01/2016  8:14 AM EDT ----- Patient's testosterone is normal.  He does not have hypogonadism.

## 2016-02-02 ENCOUNTER — Other Ambulatory Visit: Payer: Managed Care, Other (non HMO)

## 2016-02-02 DIAGNOSIS — E291 Testicular hypofunction: Secondary | ICD-10-CM

## 2016-02-02 NOTE — Telephone Encounter (Signed)
Spoke with pt in reference to lab results. Pt had labs drawn again today. Pt voiced understanding.

## 2016-02-03 ENCOUNTER — Telehealth: Payer: Self-pay | Admitting: Urology

## 2016-02-03 LAB — TESTOSTERONE: TESTOSTERONE: 390 ng/dL (ref 348–1197)

## 2016-02-03 NOTE — Telephone Encounter (Signed)
Would you fax a copy of my note from 01/29/2016 to Rexanne ManoLindsay Cornetto, NP?

## 2016-02-05 ENCOUNTER — Telehealth: Payer: Self-pay

## 2016-02-05 NOTE — Telephone Encounter (Signed)
-----   Message from Harle BattiestShannon A McGowan, PA-C sent at 02/03/2016  5:46 PM EDT ----- Patient's testosterone levels have returned within the laboratory parameters for normal testosterone.  At this time, he does not have hypogonadism.  He may wish to have his primary care provider tests for hypogonadism in the future, as testosterone levels decline as a man ages.  He may also want to return to his PCP for further evaluation of his fatigue, low back pain and joint pain.

## 2016-02-05 NOTE — Telephone Encounter (Signed)
Done ° ° °michelle °

## 2016-02-05 NOTE — Telephone Encounter (Signed)
LMOM

## 2016-02-06 NOTE — Telephone Encounter (Signed)
Spoke with pt in reference to testosterone lab results. Made aware he may want to contact PCP for further evaluation of testosterone levels as he ages, fatigue, low back pain and joint pain. Pt voiced understanding.

## 2016-10-11 DIAGNOSIS — R0789 Other chest pain: Secondary | ICD-10-CM | POA: Insufficient documentation

## 2016-10-11 DIAGNOSIS — R0602 Shortness of breath: Secondary | ICD-10-CM | POA: Insufficient documentation

## 2017-01-22 DIAGNOSIS — F32A Depression, unspecified: Secondary | ICD-10-CM | POA: Insufficient documentation

## 2017-01-22 DIAGNOSIS — F329 Major depressive disorder, single episode, unspecified: Secondary | ICD-10-CM | POA: Insufficient documentation

## 2017-03-07 ENCOUNTER — Ambulatory Visit: Payer: Commercial Managed Care - PPO | Attending: Internal Medicine

## 2017-03-07 DIAGNOSIS — G4733 Obstructive sleep apnea (adult) (pediatric): Secondary | ICD-10-CM | POA: Insufficient documentation

## 2017-04-11 ENCOUNTER — Ambulatory Visit: Payer: Commercial Managed Care - PPO | Attending: Internal Medicine

## 2017-04-11 DIAGNOSIS — G4733 Obstructive sleep apnea (adult) (pediatric): Secondary | ICD-10-CM | POA: Insufficient documentation

## 2017-05-12 ENCOUNTER — Emergency Department
Admission: EM | Admit: 2017-05-12 | Discharge: 2017-05-12 | Disposition: A | Discharge status: Home / Self Care | Payer: Commercial Managed Care - PPO | Attending: Emergency Medicine | Admitting: Emergency Medicine

## 2017-05-12 ENCOUNTER — Encounter: Payer: Self-pay | Admitting: *Deleted

## 2017-05-12 DIAGNOSIS — M791 Myalgia: Secondary | ICD-10-CM | POA: Diagnosis not present

## 2017-05-12 DIAGNOSIS — R51 Headache: Secondary | ICD-10-CM | POA: Insufficient documentation

## 2017-05-12 DIAGNOSIS — M542 Cervicalgia: Secondary | ICD-10-CM | POA: Insufficient documentation

## 2017-05-12 DIAGNOSIS — Y998 Other external cause status: Secondary | ICD-10-CM | POA: Diagnosis not present

## 2017-05-12 DIAGNOSIS — Y9389 Activity, other specified: Secondary | ICD-10-CM | POA: Diagnosis not present

## 2017-05-12 DIAGNOSIS — Y92410 Unspecified street and highway as the place of occurrence of the external cause: Secondary | ICD-10-CM | POA: Insufficient documentation

## 2017-05-12 DIAGNOSIS — Z87891 Personal history of nicotine dependence: Secondary | ICD-10-CM | POA: Diagnosis not present

## 2017-05-12 DIAGNOSIS — M7918 Myalgia, other site: Secondary | ICD-10-CM

## 2017-05-12 DIAGNOSIS — M545 Low back pain: Secondary | ICD-10-CM | POA: Diagnosis present

## 2017-05-12 MED ORDER — NAPROXEN 500 MG PO TABS: 500.0000 mg | ORAL_TABLET | Freq: Two times a day (BID) | ORAL | 0 refills | Status: DC

## 2017-05-12 MED ORDER — BACLOFEN 10 MG PO TABS
10.0000 mg | ORAL_TABLET | Freq: Three times a day (TID) | ORAL | 0 refills | Status: AC
Start: 2017-05-12 — End: ?

## 2017-05-12 NOTE — Discharge Instructions (Signed)
Follow-up with your primary care provider for symptoms that are not improving over the next few days. °Return to the emergency department for symptoms that change or worsen if you're unable to schedule an appointment. °
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
## 2017-05-17 ENCOUNTER — Encounter: Payer: Self-pay | Admitting: Emergency Medicine

## 2017-05-17 ENCOUNTER — Emergency Department
Admission: EM | Admit: 2017-05-17 | Discharge: 2017-05-17 | Disposition: A | Discharge status: Home / Self Care | Payer: Commercial Managed Care - PPO | Attending: Emergency Medicine | Admitting: Emergency Medicine

## 2017-05-17 ENCOUNTER — Emergency Department: Payer: Commercial Managed Care - PPO

## 2017-05-17 DIAGNOSIS — R109 Unspecified abdominal pain: Secondary | ICD-10-CM | POA: Diagnosis not present

## 2017-05-17 DIAGNOSIS — Z79899 Other long term (current) drug therapy: Secondary | ICD-10-CM | POA: Diagnosis not present

## 2017-05-17 DIAGNOSIS — F17200 Nicotine dependence, unspecified, uncomplicated: Secondary | ICD-10-CM | POA: Diagnosis not present

## 2017-05-17 DIAGNOSIS — K567 Ileus, unspecified: Secondary | ICD-10-CM

## 2017-05-17 LAB — COMPREHENSIVE METABOLIC PANEL
ALK PHOS: 64 U/L (ref 38–126)
ALT: 21 U/L (ref 17–63)
AST: 23 U/L (ref 15–41)
Albumin: 4.5 g/dL (ref 3.5–5.0)
Anion gap: 5 (ref 5–15)
BILIRUBIN TOTAL: 0.7 mg/dL (ref 0.3–1.2)
BUN: 17 mg/dL (ref 6–20)
CALCIUM: 9.4 mg/dL (ref 8.9–10.3)
CO2: 28 mmol/L (ref 22–32)
CREATININE: 0.98 mg/dL (ref 0.61–1.24)
Chloride: 105 mmol/L (ref 101–111)
Glucose, Bld: 96 mg/dL (ref 65–99)
Potassium: 4.5 mmol/L (ref 3.5–5.1)
SODIUM: 138 mmol/L (ref 135–145)
TOTAL PROTEIN: 7.5 g/dL (ref 6.5–8.1)

## 2017-05-17 LAB — URINALYSIS, COMPLETE (UACMP) WITH MICROSCOPIC
Bacteria, UA: NONE SEEN
Bilirubin Urine: NEGATIVE
GLUCOSE, UA: NEGATIVE mg/dL
Ketones, ur: NEGATIVE mg/dL
Leukocytes, UA: NEGATIVE
Nitrite: NEGATIVE
Protein, ur: NEGATIVE mg/dL
SPECIFIC GRAVITY, URINE: 1.003 — AB (ref 1.005–1.030)
Squamous Epithelial / LPF: NONE SEEN
WBC, UA: NONE SEEN WBC/hpf (ref 0–5)
pH: 6 (ref 5.0–8.0)

## 2017-05-17 LAB — CBC
HCT: 44.5 % (ref 40.0–52.0)
Hemoglobin: 15.2 g/dL (ref 13.0–18.0)
MCH: 29.9 pg (ref 26.0–34.0)
MCHC: 34.1 g/dL (ref 32.0–36.0)
MCV: 87.6 fL (ref 80.0–100.0)
PLATELETS: 299 10*3/uL (ref 150–440)
RBC: 5.09 MIL/uL (ref 4.40–5.90)
RDW: 12.6 % (ref 11.5–14.5)
WBC: 7.1 10*3/uL (ref 3.8–10.6)

## 2017-05-17 LAB — LIPASE, BLOOD: Lipase: 22 U/L (ref 11–51)

## 2017-05-17 MED ORDER — IOPAMIDOL (ISOVUE-300) INJECTION 61%
100.0000 mL | Freq: Once | INTRAVENOUS | Status: AC | PRN
  Administered 2017-05-17: 100 mL via INTRAVENOUS

## 2017-05-17 MED ORDER — IOPAMIDOL (ISOVUE-300) INJECTION 61%
30.0000 mL | Freq: Once | INTRAVENOUS | Status: AC | PRN
Start: 2017-05-17 — End: 2017-05-17
  Administered 2017-05-17: 30 mL via ORAL

## 2017-05-17 NOTE — ED Provider Notes (Signed)
[facility] Emergency Department Provider Note  ____________________________________________   First MD Initiated Contact with Patient 05/17/17 1310     (approximate)  I have reviewed the triage vital signs and the nursing notes.  HISTORY  Chief Complaint Abdominal Pain   HPI Tyler Curtis is a 31 y.o. male comes for evaluation for abdominal pain  Woke up early this morning, upon standing up he knows a sudden severe sharp pain in his mid abdomen. Reports is located just above the bellybutton. The pain is slowly improved throughout the day, but continues to have pain as he describes a "threw out"  Mild nausea but no vomiting. No diarrhea. Does have occasional constipation.  No fevers or chills. No history of previous surgery and tonsillectomy  Tyler Curtis describes a mild to moderate discomfort, but does not wish for any prescription pain medication due to previous history of substance abuseover 2 years ago   Past Medical History:  Diagnosis Date  . Depression   . Low testosterone     There are no active problems to display for this patient.   Past Surgical History:  Procedure Laterality Date  . ADENOIDECTOMY  2012  . TONSILLECTOMY  2007    Prior to Admission medications   Medication Sig Start Date End Date Taking? Authorizing Provider  azithromycin (ZITHROMAX) 250 MG tablet Reported on 01/29/2016 11/27/15   [provider]  baclofen (LIORESAL) 10 MG tablet Take 1 tablet (10 mg total) by mouth 3 (three) times daily. 05/12/17   Triplett, Rulon Eisenmenger B, FNP  buPROPion (WELLBUTRIN XL) 150 MG 24 hr tablet Take by mouth. 11/03/15   [provider]  cefdinir (OMNICEF) 300 MG capsule Reported on 01/29/2016 11/03/15   [provider]  cholecalciferol (VITAMIN D) 1000 units tablet Take 1,000 Units by mouth daily.    [provider]  Cranberry 250 MG TABS Take by mouth.    [provider]  diazepam (VALIUM) 10 MG tablet Take 30  minutes prior to the vasectomy 01/29/16   Michiel Cowboy A, PA-C  Melatonin 10 MG CAPS Take by mouth.    [provider]  Multiple Vitamin (MULTIVITAMIN) tablet Take 1 tablet by mouth daily.    [provider]  naproxen (NAPROSYN) 500 MG tablet Take 1 tablet (500 mg total) by mouth 2 (two) times daily with a meal. 05/12/17   Triplett, Tyler Curtis B, FNP  predniSONE (DELTASONE) 10 MG tablet Reported on 01/29/2016 11/03/15   [provider]    Allergies Amoxicillin  Family History  Problem Relation Age of Onset  . Colon cancer Paternal Grandfather   . Pancreatic cancer Paternal Grandfather   . Heart disease Paternal Grandfather   . Esophageal cancer Maternal Grandfather   . Heart disease Maternal Grandfather   . Huntington's disease Paternal Grandmother   . Kidney disease Neg Hx   . Prostate cancer Neg Hx     Social History Social History  Substance Use Topics  . Smoking status: Current Every Day Smoker  . Smokeless tobacco: Never Used     Comment: quit 8 days  . Alcohol use No    Review of Systems Constitutional: No fever/chills Eyes: No visual changes. ENT: No sore throat. Cardiovascular: Denies chest pain. Respiratory: Denies shortness of breath. Gastrointestinal: No diarrhea.   Genitourinary: Negative for dysuria. Musculoskeletal: Negative for back pain. Skin: Negative for rash. Neurological: Negative for headaches, focal weakness or numbness.    ____________________________________________   PHYSICAL EXAM:  VITAL SIGNS: ED Triage Vitals  Enc Vitals Group     BP 05/17/17 1116 123/85     Pulse Rate 05/17/17 1116 72     Resp 05/17/17 1116 20     Temp 05/17/17 1116 98.2 F (36.8 C)     Temp Source 05/17/17 1116 Oral     SpO2 05/17/17 1116 98 %     Weight 05/17/17 1116 208 lb (94.3 kg)     Height 05/17/17 1116 6' (1.829 m)     Head Circumference --      Peak Flow --      Pain Score 05/17/17 1126 8     Pain Loc --      Pain Edu? --       Excl. in GC? --     Constitutional: Alert and oriented. Well appearing and in no acute distress. Eyes: Conjunctivae are normal. Head: Atraumatic. Nose: No congestion/rhinnorhea. Mouth/Throat: Mucous membranes are moist. Neck: No stridor.   Cardiovascular: Normal rate, regular rhythm. Grossly normal heart sounds.  Good peripheral circulation. Respiratory: Normal respiratory effort.  No retractions. Lungs CTAB. Gastrointestinal: Soft and Mild to moderate tenderness throughout, slightly worse and epigastrium. No rebound or guarding or evidence of peritonitis. No umbilical mass or hernia. No groin mass or hernia. No scrotal tenderness or edema. Musculoskeletal: No lower extremity tenderness nor edema. Neurologic:  Normal speech and language. No gross focal neurologic deficits are appreciated.  Skin:  Skin is warm, dry and intact. No rash noted. Psychiatric: Mood and affect are normal. Speech and behavior are normal.  ____________________________________________   LABS (all labs ordered are listed, but only abnormal results are displayed)  Labs Reviewed  URINALYSIS, COMPLETE (UACMP) WITH MICROSCOPIC - Abnormal; Notable for the following:       Result Value   Color, Urine COLORLESS (*)    APPearance CLEAR (*)    Specific Gravity, Urine 1.003 (*)    Hgb urine dipstick SMALL (*)    All other components within normal limits  LIPASE, BLOOD  COMPREHENSIVE METABOLIC PANEL  CBC   ____________________________________________  EKG   ____________________________________________  RADIOLOGY  Ct Abdomen Pelvis W Contrast  Result Date: 05/17/2017 CLINICAL DATA:  Diffuse abdominal pain for 1 hour for prior to arrival. Nausea. EXAM: CT ABDOMEN AND PELVIS WITH CONTRAST TECHNIQUE: Multidetector CT imaging of the abdomen and pelvis was performed using the standard protocol following bolus administration of intravenous contrast. CONTRAST:  ISOVUE-300 IOPAMIDOL (ISOVUE-300) INJECTION 61%  COMPARISON:  None. FINDINGS: Lower chest: Clear lung bases. Hepatobiliary: Tiny cyst in the dome of the liver. Normal appearing gallbladder. Pancreas: Unremarkable. No pancreatic ductal dilatation or surrounding inflammatory changes. Spleen: Normal sized spleen with small accessory splenules. Adrenals/Urinary Tract: Adrenal glands are unremarkable. Kidneys are normal, without renal calculi, focal lesion, or hydronephrosis. Bladder is unremarkable. Stomach/Bowel: Stomach is within normal limits. Appendix appears normal. No evidence of bowel wall thickening, distention, or inflammatory changes. Vascular/Lymphatic: Mildly dilated loops of jejunum in the left mid abdomen anteriorly. No obstructing mass seen. Normal caliber distal small bowel and colon. Normal appearing stomach. Normal appearing appendix. Reproductive: Prostate is unremarkable. Other: Very small umbilical hernia containing fat. Musculoskeletal: Unremarkable bones. IMPRESSION: Mild jejunal ileus.  Otherwise, unremarkable examination. Electronically Signed   By: Beckie Salts M.D.   On: 05/17/2017 15:34    ____________________________________________   PROCEDURES  Procedure(s) performed: None  Procedures  Critical Care performed: No  ____________________________________________   INITIAL IMPRESSION / ASSESSMENT AND PLAN / ED COURSE  Pertinent labs & imaging results that were available  during my care of the patient were reviewed by me and considered in my medical decision making (see chart for details).  Differential diagnosis includes but is not limited to, abdominal perforation, aortic dissection, cholecystitis, appendicitis, diverticulitis, colitis, esophagitis/gastritis, kidney stone, pyelonephritis, urinary tract infection. All are considered in decision and treatment plan. Based upon the patient's presentation and risk factors, we'll proceed a CT abdomen and pelvis is slightly unclear as the etiology of abdominal pain. Does seem  to be made worse by palpation primarily epigastrium without obvious evidence of hernia.  ----------------------------------------- 4:25 PM on 05/17/2017 -----------------------------------------  Patient is alert. Possible ileus on CT. Unclear the etiology. Due to patient's abdominal symptoms with concerning ileus, placed a consult to Dr. Earlene Plateravis of surgery for further recommendations. Ongoing care assigned to Dr. Mayford KnifeWilliams with planned follow-up on general surgery recommendations.       ____________________________________________   FINAL CLINICAL IMPRESSION(S) / ED DIAGNOSES  Final diagnoses:  Ileus, unspecified (HCC)      NEW MEDICATIONS STARTED DURING THIS VISIT:  New Prescriptions   No medications on file     Note:  This document was prepared using Dragon voice recognition software and may include unintentional dictation errors.     Sharyn CreamerQuale, Yecenia Dalgleish, MD 05/17/17 1626
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
## 2017-05-17 NOTE — ED Triage Notes (Signed)
Pt to ED via POV for generalized abdominal pain that started 1 hour PTA. Pt states that he has been nauseated but no vomiting, pt denies fevers. Pt does not appear to be in any distress at this time.
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
## 2017-05-27 DIAGNOSIS — R109 Unspecified abdominal pain: Secondary | ICD-10-CM | POA: Insufficient documentation

## 2017-05-29 ENCOUNTER — Encounter: Payer: Self-pay | Admitting: Emergency Medicine

## 2017-05-29 ENCOUNTER — Emergency Department: Payer: Commercial Managed Care - PPO

## 2017-05-29 ENCOUNTER — Emergency Department
Admission: EM | Admit: 2017-05-29 | Discharge: 2017-05-29 | Disposition: A | Discharge status: Home / Self Care | Payer: Commercial Managed Care - PPO | Attending: Emergency Medicine | Admitting: Emergency Medicine

## 2017-05-29 DIAGNOSIS — R109 Unspecified abdominal pain: Secondary | ICD-10-CM

## 2017-05-29 DIAGNOSIS — R1013 Epigastric pain: Secondary | ICD-10-CM | POA: Diagnosis not present

## 2017-05-29 DIAGNOSIS — R110 Nausea: Secondary | ICD-10-CM | POA: Diagnosis not present

## 2017-05-29 DIAGNOSIS — Z79899 Other long term (current) drug therapy: Secondary | ICD-10-CM | POA: Diagnosis not present

## 2017-05-29 DIAGNOSIS — F17210 Nicotine dependence, cigarettes, uncomplicated: Secondary | ICD-10-CM | POA: Diagnosis not present

## 2017-05-29 LAB — CBC
HEMATOCRIT: 41.2 % (ref 40.0–52.0)
Hemoglobin: 14.4 g/dL (ref 13.0–18.0)
MCH: 30.5 pg (ref 26.0–34.0)
MCHC: 35 g/dL (ref 32.0–36.0)
MCV: 87 fL (ref 80.0–100.0)
PLATELETS: 297 10*3/uL (ref 150–440)
RBC: 4.73 MIL/uL (ref 4.40–5.90)
RDW: 13.2 % (ref 11.5–14.5)
WBC: 7 10*3/uL (ref 3.8–10.6)

## 2017-05-29 LAB — URINALYSIS, COMPLETE (UACMP) WITH MICROSCOPIC
BILIRUBIN URINE: NEGATIVE
Bacteria, UA: NONE SEEN
GLUCOSE, UA: NEGATIVE mg/dL
HGB URINE DIPSTICK: NEGATIVE
Ketones, ur: NEGATIVE mg/dL
LEUKOCYTES UA: NEGATIVE
NITRITE: NEGATIVE
PH: 8 (ref 5.0–8.0)
Protein, ur: NEGATIVE mg/dL
RBC / HPF: NONE SEEN RBC/hpf (ref 0–5)
SPECIFIC GRAVITY, URINE: 1.001 — AB (ref 1.005–1.030)
Squamous Epithelial / LPF: NONE SEEN
WBC, UA: NONE SEEN WBC/hpf (ref 0–5)

## 2017-05-29 LAB — COMPREHENSIVE METABOLIC PANEL
ALT: 18 U/L (ref 17–63)
AST: 21 U/L (ref 15–41)
Albumin: 4.7 g/dL (ref 3.5–5.0)
Alkaline Phosphatase: 67 U/L (ref 38–126)
Anion gap: 6 (ref 5–15)
BILIRUBIN TOTAL: 0.9 mg/dL (ref 0.3–1.2)
BUN: 14 mg/dL (ref 6–20)
CO2: 28 mmol/L (ref 22–32)
CREATININE: 1.07 mg/dL (ref 0.61–1.24)
Calcium: 9.3 mg/dL (ref 8.9–10.3)
Chloride: 105 mmol/L (ref 101–111)
Glucose, Bld: 92 mg/dL (ref 65–99)
POTASSIUM: 4.1 mmol/L (ref 3.5–5.1)
Sodium: 139 mmol/L (ref 135–145)
TOTAL PROTEIN: 7.6 g/dL (ref 6.5–8.1)

## 2017-05-29 LAB — LIPASE, BLOOD: LIPASE: 21 U/L (ref 11–51)

## 2017-05-29 MED ORDER — IOPAMIDOL (ISOVUE-300) INJECTION 61%
100.0000 mL | Freq: Once | INTRAVENOUS | Status: AC | PRN
  Administered 2017-05-29: 100 mL via INTRAVENOUS

## 2017-05-29 NOTE — ED Notes (Signed)
Pt back from CT. MD notified of patient's concerns.

## 2017-05-29 NOTE — Discharge Instructions (Signed)
Continue your medications. Take the Prilosec twice a day instead of once a day. Noted clinic should call you and they will try to fit nightly tomorrow or early next week. Please return for worse pain fever vomiting or feeling sicker.

## 2017-05-29 NOTE — ED Notes (Signed)
Pt finished drinking CT contrast, CT called.

## 2017-05-29 NOTE — ED Triage Notes (Signed)
Pt reports continued abdominal pain after diagnosis of ileus on 05/17/17. Pt reports NVD. Pt reports has made multiple attempts to see GI but can not be seen until 06/19/17.

## 2017-05-29 NOTE — ED Notes (Signed)
Pt to CT

## 2017-05-29 NOTE — ED Provider Notes (Signed)
[facility] Emergency Department Provider Note    ____________________________________________   First MD Initiated Contact with Patient 05/29/17 1512     (approximate)  I have reviewed the triage vital signs and the nursing notes.   HISTORY  Chief Complaint Abdominal Pain  HPI Tyler Curtis is a 31 y.o. male who was seen in the emergency room on the 14th. He had a CT at that time showed a jejunal ileus and nothing much else. He has follow-up with gastroenterology on the 16th Month. He is taking Bentyl and Prilosec for his pain. These pills helped initially but they're not helping now. The pain is getting worse. It sharp and starts in the epigastric area radiating down the middle of the abdomen and then to the right lower quadrant. Pain is worse with eating and worse with movement. He has not had a fever. Although he does feel hot. He has had some nausea but no vomiting or diarrhea. He reports she's not passing a lot of gas at all today. The pain is as I said severe.   Past Medical History:  Diagnosis Date  . Depression   . Low testosterone     Patient Active Problem List   Diagnosis Date Noted  . Abdominal pain     Past Surgical History:  Procedure Laterality Date  . ADENOIDECTOMY  2012  . TONSILLECTOMY  2007    Prior to Admission medications   Medication Sig Start Date End Date Taking? Authorizing Provider  buPROPion (WELLBUTRIN XL) 150 MG 24 hr tablet Take 150 mg by mouth daily.  11/03/15  Yes [provider]  dicyclomine (BENTYL) 10 MG capsule Take 10 mg by mouth 2 (two) times daily as needed for spasms.  05/22/17  Yes [provider]  Melatonin 10 MG CAPS Take 10 mg by mouth at bedtime as needed (Sleep).    Yes [provider]  Multiple Vitamin (MULTIVITAMIN) tablet Take 1 tablet by mouth daily.   Yes [provider]  omeprazole (PRILOSEC) 20 MG capsule Take 20 mg by mouth daily.  05/22/17  Yes [provider]  baclofen (LIORESAL) 10 MG tablet Take 1 tablet (10 mg total) by mouth 3 (three) times daily. Patient not taking: Reported on 05/29/2017 05/12/17   Kem Boroughsriplett, Cari B, FNP  diazepam (VALIUM) 10 MG tablet Take 30 minutes prior to the vasectomy Patient not taking: Reported on 05/29/2017 01/29/16   Michiel CowboyMcGowan, Shannon A, PA-C  naproxen (NAPROSYN) 500 MG tablet Take 1 tablet (500 mg total) by mouth 2 (two) times daily with a meal. Patient not taking: Reported on 05/29/2017 05/12/17   Kem Boroughsriplett, Cari B, FNP    Allergies Amoxicillin  Family History  Problem Relation Age of Onset  . Colon cancer Paternal Grandfather   . Pancreatic cancer Paternal Grandfather   . Heart disease Paternal Grandfather   . Esophageal cancer Maternal Grandfather   . Heart disease Maternal Grandfather   . Huntington's disease Paternal Grandmother   . Kidney disease Neg Hx   . Prostate cancer Neg Hx     Social History Social History  Substance Use Topics  . Smoking status: Current Every Day Smoker  . Smokeless tobacco: Never Used     Comment: quit 8 days  . Alcohol use No    Review of Systems  Constitutional: No fever/chills Eyes: No visual changes. ENT: No sore throat. Cardiovascular: Denies chest pain. Respiratory: Denies shortness of breath. Gastrointestinal: See history of present illness Genitourinary: Negative for dysuria.  Musculoskeletal: Negative for back pain. Skin: Negative for rash. Neurological: Negative for headaches, focal weakness or numbness.   ____________________________________________   PHYSICAL EXAM:  VITAL SIGNS: ED Triage Vitals  Enc Vitals Group     BP 05/29/17 1212 (!) 142/88     Pulse Rate 05/29/17 1212 91     Resp 05/29/17 1212 18     Temp 05/29/17 1212 98.5 F (36.9 C)     Temp Source 05/29/17 1212 Oral     SpO2 05/29/17 1212 96 %     Weight 05/29/17 1212 208 lb (94.3 kg)     Height 05/29/17 1212 6' (1.829 m)     Head Circumference --      Peak Flow --       Pain Score 05/29/17 1211 7     Pain Loc --      Pain Edu? --      Excl. in GC? --     Constitutional: Alert and oriented. Well appearing and in no acute distress. Eyes: Conjunctivae are normal.  Head: Atraumatic. Nose: No congestion/rhinnorhea. Mouth/Throat: Mucous membranes are moist.  Oropharynx non-erythematous. Neck: No stridor.  Cardiovascular: Normal rate, regular rhythm. Grossly normal heart sounds.  Good peripheral circulation. Respiratory: Normal respiratory effort.  No retractions. Lungs CTAB. Gastrointestinal: Soft Decreased bowel sounds diffusely tender especially in the epigastrium and right lower quadrant.. No distention. No abdominal bruits. No CVA tenderness. Musculoskeletal: No lower extremity tenderness nor edema.  No joint effusions. Neurologic:  Normal speech and language. No gross focal neurologic deficits are appreciated. No gait instability. Skin:  Skin is warm, dry and intact. No rash noted. Psychiatric: Mood and affect are normal. Speech and behavior are normal.  ____________________________________________   LABS (all labs ordered are listed, but only abnormal results are displayed)  Labs Reviewed  URINALYSIS, COMPLETE (UACMP) WITH MICROSCOPIC - Abnormal; Notable for the following:       Result Value   Color, Urine COLORLESS (*)    APPearance CLEAR (*)    Specific Gravity, Urine 1.001 (*)    All other components within normal limits  LIPASE, BLOOD  COMPREHENSIVE METABOLIC PANEL  CBC   ____________________________________________  EKG   ____________________________________________  RADIOLOGY   ____________________________________________   PROCEDURES  Procedure(s) performed:  Procedures  Critical Care performed:   ____________________________________________   INITIAL IMPRESSION / ASSESSMENT AND PLAN / ED COURSE  Pertinent labs & imaging results that were available during my care of the patient were reviewed by me and considered  in my medical decision making (see chart for details). Discussed with Dr. Mechele CollinElliott who is not on call. He will have scrotal clinic call the patient and try to squeeze him in tomorrow or next week. He wanted his medicines and take the Prilosec twice a day.   Clinical Course as of May 30 1903  Thu May 29, 2017  1536 Glucose: 92 [PM]  1836 MCHC: 35.0 [PM]    Clinical Course User Index [PM] Arnaldo NatalMalinda, Suzan Manon F, MD     ____________________________________________   FINAL CLINICAL IMPRESSION(S) / ED DIAGNOSES  Final diagnoses:  Abdominal pain, unspecified abdominal location      NEW MEDICATIONS STARTED DURING THIS VISIT:  New Prescriptions   No medications on file     Note:  This document was prepared using Dragon voice recognition software and may include unintentional dictation errors.    Arnaldo NatalMalinda, Zair Borawski F, MD 05/29/17 [phone]

## 2017-06-06 ENCOUNTER — Other Ambulatory Visit: Payer: Self-pay | Admitting: Student

## 2017-06-06 DIAGNOSIS — R112 Nausea with vomiting, unspecified: Secondary | ICD-10-CM

## 2017-06-06 DIAGNOSIS — R1013 Epigastric pain: Secondary | ICD-10-CM

## 2017-06-06 DIAGNOSIS — R1012 Left upper quadrant pain: Secondary | ICD-10-CM

## 2017-06-11 ENCOUNTER — Ambulatory Visit
Admission: RE | Admit: 2017-06-11 | Discharge: 2017-06-11 | Disposition: A | Discharge status: Home / Self Care | Payer: Commercial Managed Care - PPO | Source: Ambulatory Visit | Attending: Student | Admitting: Student

## 2017-06-11 DIAGNOSIS — Z8719 Personal history of other diseases of the digestive system: Secondary | ICD-10-CM | POA: Insufficient documentation

## 2017-06-11 DIAGNOSIS — R1013 Epigastric pain: Secondary | ICD-10-CM | POA: Diagnosis present

## 2017-06-11 DIAGNOSIS — R112 Nausea with vomiting, unspecified: Secondary | ICD-10-CM | POA: Diagnosis not present

## 2017-06-11 DIAGNOSIS — R1012 Left upper quadrant pain: Secondary | ICD-10-CM | POA: Insufficient documentation

## 2017-06-17 ENCOUNTER — Other Ambulatory Visit
Admission: RE | Admit: 2017-06-17 | Discharge: 2017-06-17 | Disposition: A | Discharge status: Home / Self Care | Payer: Commercial Managed Care - PPO | Source: Ambulatory Visit | Attending: Student | Admitting: Student

## 2017-06-17 DIAGNOSIS — R748 Abnormal levels of other serum enzymes: Secondary | ICD-10-CM | POA: Diagnosis not present

## 2017-06-17 DIAGNOSIS — R7982 Elevated C-reactive protein (CRP): Secondary | ICD-10-CM | POA: Insufficient documentation

## 2017-06-23 LAB — CALPROTECTIN, FECAL: CALPROTECTIN, FECAL: 77 ug/g (ref 0–120)

## 2017-06-23 LAB — PANCREATIC ELASTASE, FECAL: PANCREATIC ELASTASE-1, STL: 499 ug Elast./g (ref >200)

## 2018-03-19 ENCOUNTER — Encounter: Payer: Self-pay | Admitting: Urology

## 2018-03-19 ENCOUNTER — Ambulatory Visit: Payer: Commercial Managed Care - PPO | Admitting: Urology

## 2018-03-19 VITALS — BP 126/81 | HR 90 | Resp 16 | Ht 72.0 in | Wt 219.9 lb

## 2018-03-19 DIAGNOSIS — Z3009 Encounter for other general counseling and advice on contraception: Secondary | ICD-10-CM

## 2018-03-19 MED ORDER — DIAZEPAM 10 MG PO TABS: ORAL_TABLET | ORAL | 0 refills | Status: AC

## 2018-03-19 NOTE — Progress Notes (Signed)
03/19/2018 12:14 PM   [name] 08/07/1986 [id number]  Referring provider: Raynelle Bring [address] [place], [place] [address]  Chief Complaint  Patient presents with  . VAS Consult    HPI: Tyler Curtis is a 32 year old male who presents for vasectomy consultation.  He is married with 3 children and his wife is due with their fourth any day.  He denies previous history of urologic problems and specifically denies history of epididymitis or chronic scrotal pain.   PMH: Past Medical History:  Diagnosis Date  . Depression   . Low testosterone     Surgical History: Past Surgical History:  Procedure Laterality Date  . ADENOIDECTOMY  2012  . TONSILLECTOMY  2007    Home Medications:  Allergies as of 03/19/2018      Reactions   Amoxicillin Rash      Medication List        Accurate as of 03/19/18 12:14 PM. Always use your most recent med list.          baclofen 10 MG tablet Commonly known as:  LIORESAL Take 1 tablet (10 mg total) by mouth 3 (three) times daily.   buPROPion 150 MG 24 hr tablet Commonly known as:  WELLBUTRIN XL Take 150 mg by mouth daily.   diazepam 10 MG tablet Commonly known as:  VALIUM Take 30 minutes prior to the vasectomy   dicyclomine 10 MG capsule Commonly known as:  BENTYL Take 10 mg by mouth 2 (two) times daily as needed for spasms.   Melatonin 10 MG Caps Take 10 mg by mouth at bedtime as needed (Sleep).   multivitamin tablet Take 1 tablet by mouth daily.   naproxen 500 MG tablet Commonly known as:  NAPROSYN Take 1 tablet (500 mg total) by mouth 2 (two) times daily with a meal.   omeprazole 20 MG capsule Commonly known as:  PRILOSEC Take 20 mg by mouth daily.       Allergies:  Allergies  Allergen Reactions  . Amoxicillin Rash    Family History: Family History  Problem Relation Age of Onset  . Colon cancer Paternal Grandfather   . Pancreatic cancer Paternal Grandfather   . Heart disease Paternal  Grandfather   . Esophageal cancer Maternal Grandfather   . Heart disease Maternal Grandfather   . Huntington's disease Paternal Grandmother   . Kidney disease Neg Hx   . Prostate cancer Neg Hx     Social History:  reports that he has been smoking.  He has never used smokeless tobacco. He reports that he does not drink alcohol or use drugs.  ROS: UROLOGY Frequent Urination?: No Hard to postpone urination?: No Burning/pain with urination?: No Get up at night to urinate?: No Leakage of urine?: No Urine stream starts and stops?: No Trouble starting stream?: No Do you have to strain to urinate?: No Blood in urine?: No Urinary tract infection?: No Sexually transmitted disease?: No Injury to kidneys or bladder?: No Painful intercourse?: No Weak stream?: No Erection problems?: No Penile pain?: No  Gastrointestinal Nausea?: No Vomiting?: No Indigestion/heartburn?: No Diarrhea?: No Constipation?: No  Constitutional Fever: No Night sweats?: No Weight loss?: No Fatigue?: No  Skin Skin rash/lesions?: No Itching?: No  Eyes Blurred vision?: No Double vision?: No  Ears/Nose/Throat Sore throat?: No Sinus problems?: No  Hematologic/Lymphatic Swollen glands?: No Easy bruising?: No  Cardiovascular Leg swelling?: No Chest pain?: No  Respiratory Cough?: No Shortness of breath?: No  Endocrine Excessive thirst?: No  Musculoskeletal Back  pain?: No Joint pain?: No  Neurological Headaches?: No Dizziness?: No  Psychologic Depression?: No Anxiety?: No  Physical Exam: BP 126/81   Pulse 90   Resp 16   Ht 6' (1.829 m)   Wt 219 lb 14.4 oz (99.7 kg)   SpO2 98%   BMI 29.82 kg/m   Constitutional:  Alert and oriented, No acute distress. HEENT:  AT, moist mucus membranes.  Trachea midline, no masses. Cardiovascular: No clubbing, cyanosis, or edema. Respiratory: Normal respiratory effort, no increased work of breathing. GI: Abdomen is soft, nontender,  nondistended, no abdominal masses GU: No CVA tenderness.  Penis without lesions.  Testes descended bilaterally without masses or tenderness.  No paratesticular abnormalities.  Vasa easily palpable bilaterally. Lymph: No cervical or inguinal lymphadenopathy. Skin: No rashes, bruises or suspicious lesions. Neurologic: Grossly intact, no focal deficits, moving all 4 extremities. Psychiatric: Normal mood and affect.   Assessment & Plan:   We had a long discussion about vasectomy. We specifically discussed the procedure, recovery and the risks, benefits and alternatives of vasectomy. I explained that the procedure entails removal of a segment of each vas deferens, each of which conducts sperm, and that the purpose of this procedure is to cause sterility (inability to produce children or cause pregnancy). Vasectomy is intended to be permanent and irreversible form of contraception. Options for fertility after vasectomy include vasectomy reversal, or sperm retrieval with in vitro fertilization. These options are not always successful, and they may be expensive. We discussed reversible forms of birth control such as condoms, IUD or diaphragms, as well as the option of freezing sperm in a sperm bank prior to the vasectomy procedure. We discussed the importance of avoiding strenuous exercise for four days after vasectomy, and the importance of refraining from any form of ejaculation for seven days after vasectomy. I explained that vasectomy does not produce immediate sterility so another form of contraceptive must be used until sterility is assured by having semen checked for sperm. Thus, a post vasectomy semen analysis is necessary to confirm sterility. Rarely, vasectomy must be repeated. We discussed the approximately 1 in 2,000 risk of pregnancy after vasectomy for men who have post-vasectomy semen analysis showing absent sperm or rare non-motile sperm. Typical side effects include a small amount of oozing blood,  some discomfort and mild swelling in the area of incision.  Vasectomy does not affect sexual performance, function, please, sensation, interest, desire, satisfaction, penile erection, volume of semen or ejaculation. Other rare risks include allergy or adverse reaction to an anesthetic, testicular atrophy, hematoma, infection/abscess, prolonged tenderness of the vas deferens, pain, swelling, painful nodule or scar (called sperm granuloma) or epididymtis. We discussed chronic testicular pain syndrome. This has been reported to occur in as many as 1-2% of men and may be permanent. This can be treated with medication, small procedures or (rarely) surgery.  Rx Valium was sent to pharmacy as a preprocedure anxiolytic.  He was informed he will need a driver.    Riki Altes, MD  [facility] [address], [address] [place], [place] [address] [phone]

## 2018-05-01 ENCOUNTER — Encounter: Payer: Commercial Managed Care - PPO | Admitting: Urology

## 2018-06-02 IMAGING — CT CT ABD-PELV W/ CM
2 of 4 series · 16 of 46 positions shown, 18 images · IV contrast (APPLIED)
Comparison: 05/17/2017

CLINICAL DATA: Continued abdominal pain with recent ileus diagnosis

EXAM:
CT ABDOMEN AND PELVIS WITH CONTRAST
TECHNIQUE: Multidetector CT imaging of the abdomen and pelvis was performed
using the standard protocol following bolus administration of
intravenous contrast.
CONTRAST:  100mL OKFGOY-ZWW IOPAMIDOL (OKFGOY-ZWW) INJECTION 61%

[Series 2: routine abd/pel with · axial · 0.75mm/px · z∈[-992,-527]mm · 13 of 103 slices shown, 15 images]
[im 5/103  soft-tissue]
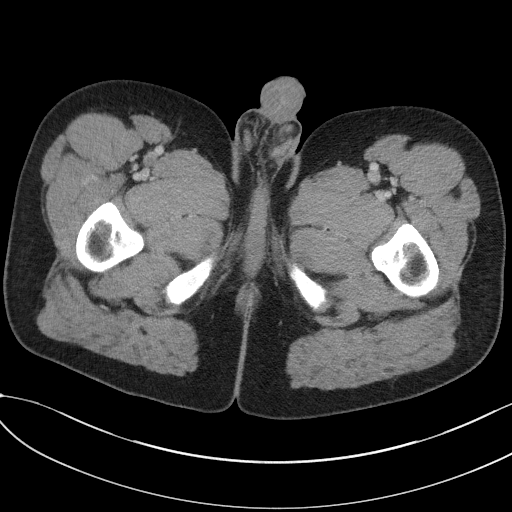
[im 5/103  bone]
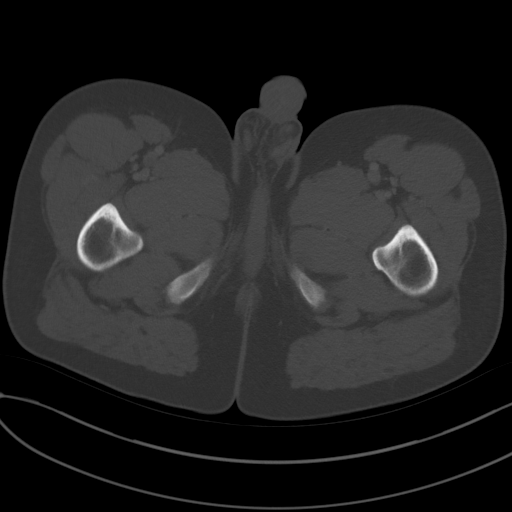
[im 13/103  soft-tissue]
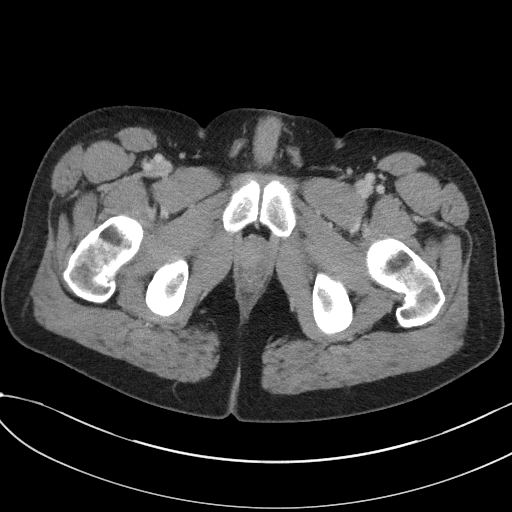
[im 22/103  soft-tissue]
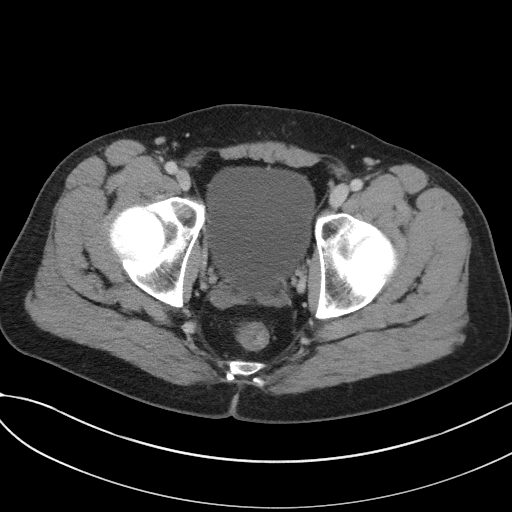
[im 30/103  soft-tissue]
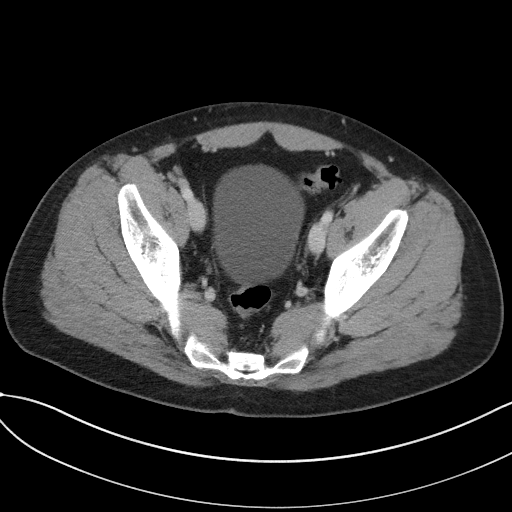
[im 35/103  soft-tissue]
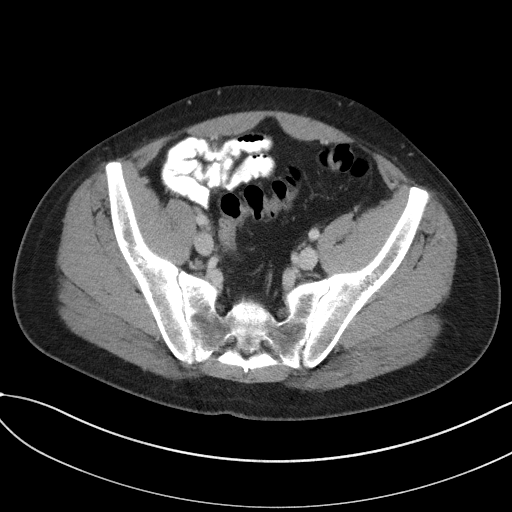
[im 43/103  soft-tissue]
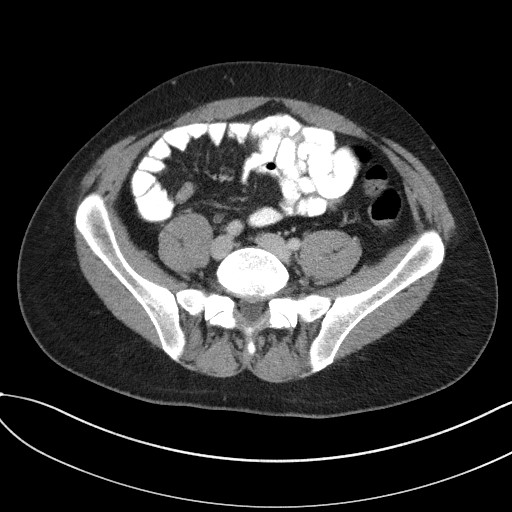
[im 52/103  soft-tissue]
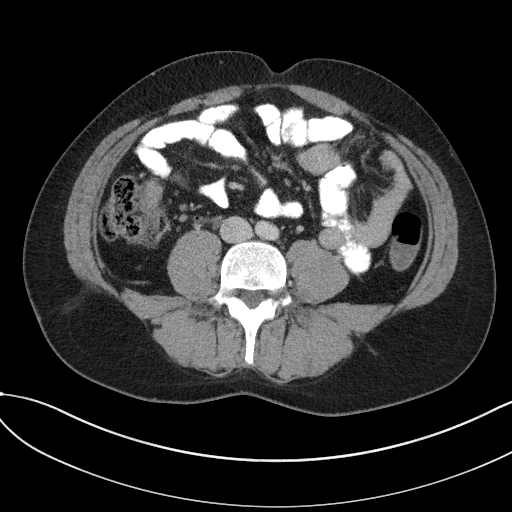
[im 60/103  soft-tissue]
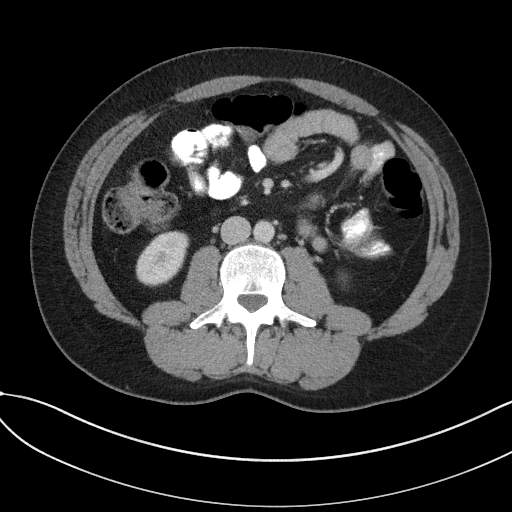
[im 69/103  soft-tissue]
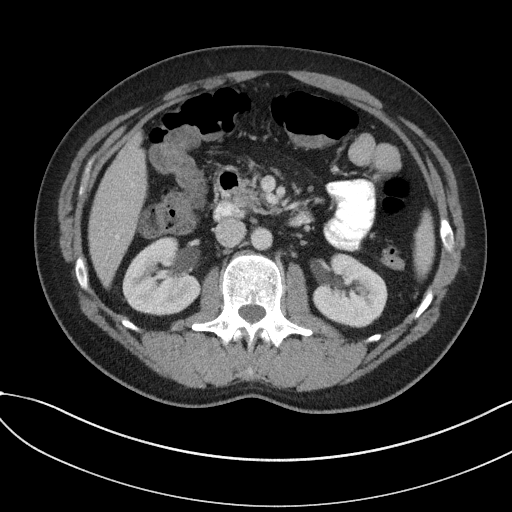
[im 69/103  bone]
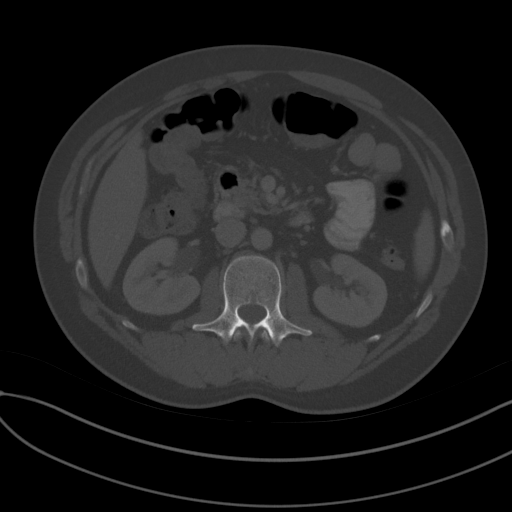
[im 73/103  soft-tissue]
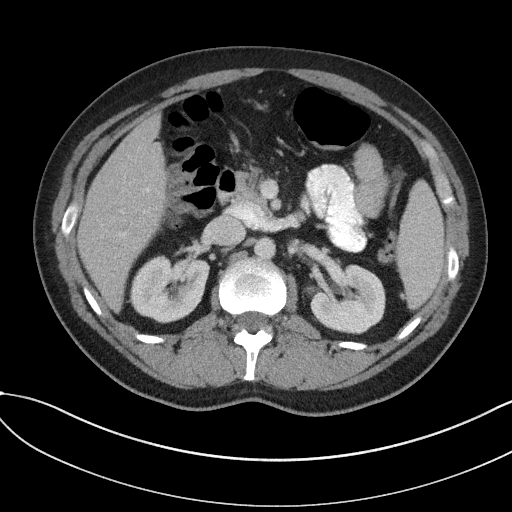
[im 81/103  soft-tissue]
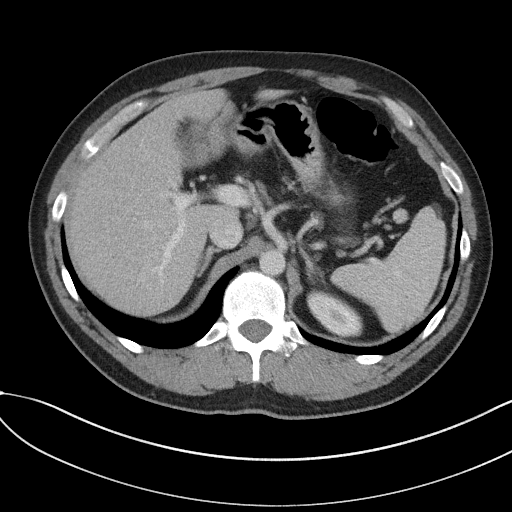
[im 90/103  soft-tissue]
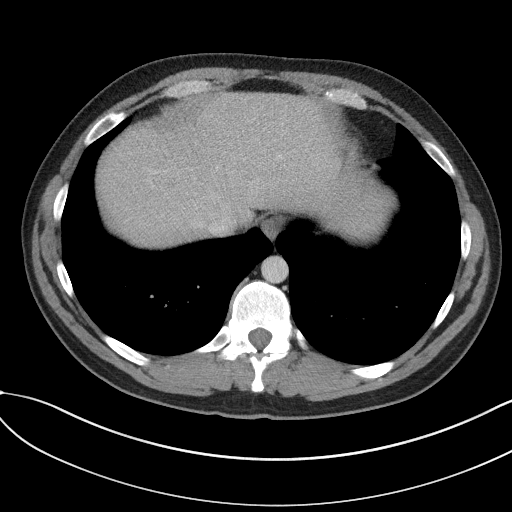
[im 98/103  soft-tissue]
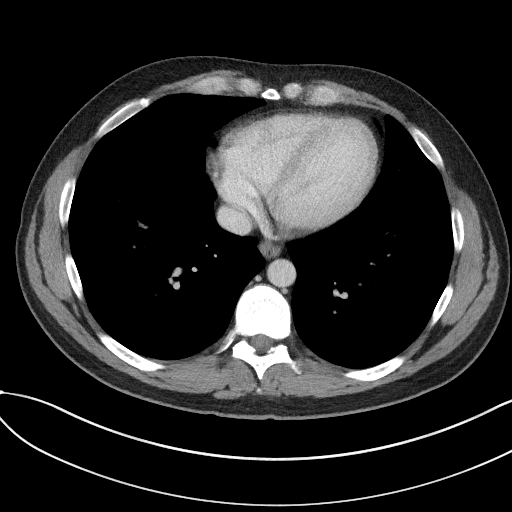

[Series 5: coronal st · coronal · 0.78mm/px · 3 of 87 slices shown]
[im 29/87  soft-tissue]
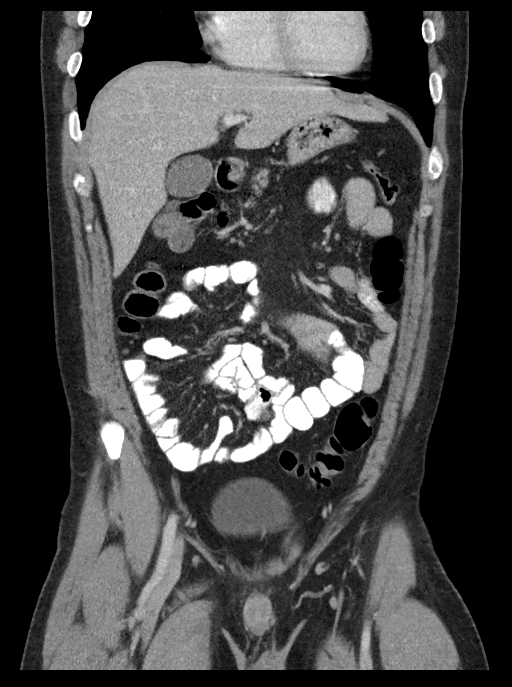
[im 39/87  soft-tissue]
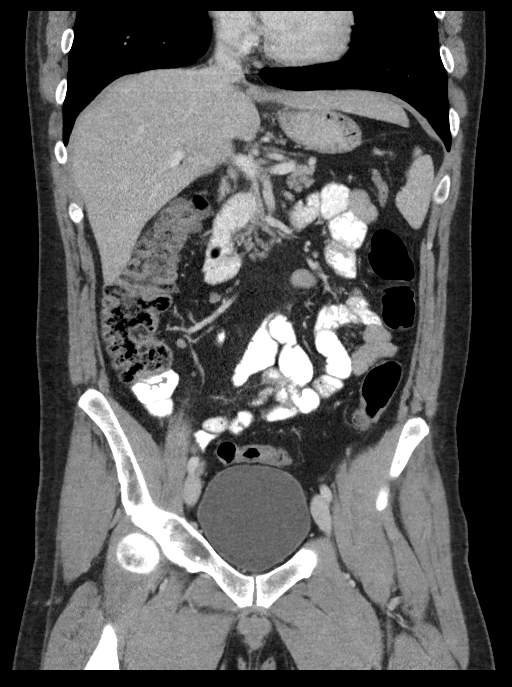
[im 48/87  soft-tissue]
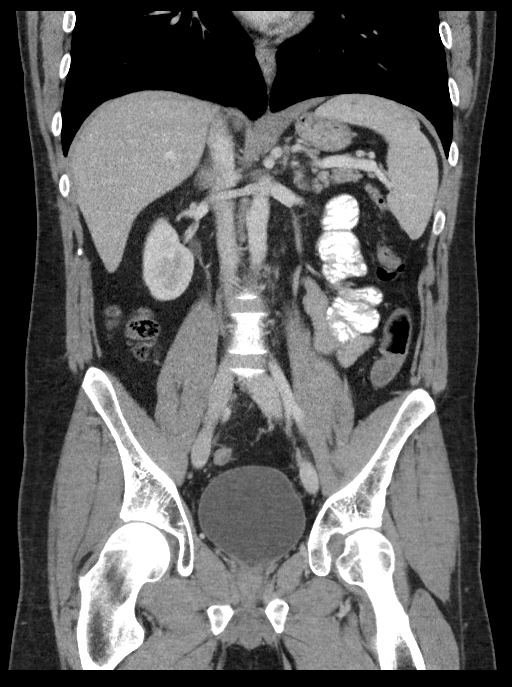

[16 of 46 positions shown; findings below may reference images not displayed]

FINDINGS: Lower chest: No acute abnormality.

Hepatobiliary: Mild fatty infiltration of the liver is noted. The
gallbladder is within normal limits.

Pancreas: Unremarkable. No pancreatic ductal dilatation or
surrounding inflammatory changes.

Spleen: Normal in size without focal abnormality.

Adrenals/Urinary Tract: Adrenal glands are within normal limits.
Mild extrarenal pelves are noted bilaterally without obstructive
change. The bladder is well distended.

Stomach/Bowel: Stomach is within normal limits. Appendix appears
normal. No evidence of bowel wall thickening, distention, or
inflammatory changes.

Vascular/Lymphatic: No significant vascular findings are present. No
enlarged abdominal or pelvic lymph nodes.

Reproductive: Prostate is unremarkable.

Other: No abdominal wall hernia or abnormality. No abdominopelvic
ascites.

Musculoskeletal: No acute or significant osseous findings.
IMPRESSION: No acute abnormality noted. The previously seen mild jejunal
prominence has resolved in the interval.

## 2018-06-12 ENCOUNTER — Encounter: Payer: Self-pay | Admitting: Urology

## 2018-06-12 ENCOUNTER — Ambulatory Visit: Payer: Commercial Managed Care - PPO | Admitting: Urology

## 2018-06-12 ENCOUNTER — Telehealth: Payer: Self-pay

## 2018-06-12 VITALS — BP 146/93 | HR 98 | Ht 72.0 in | Wt 234.7 lb

## 2018-06-12 DIAGNOSIS — Z302 Encounter for sterilization: Secondary | ICD-10-CM

## 2018-06-12 MED ORDER — HYDROCODONE-ACETAMINOPHEN 5-325 MG PO TABS: 1.0000 | ORAL_TABLET | ORAL | 0 refills | Status: AC | PRN

## 2018-06-12 NOTE — Telephone Encounter (Signed)
Opened error

## 2018-06-12 NOTE — Progress Notes (Signed)
Vasectomy Procedure Note  Indications: The patient is a 32 y.o. male who presents today for elective sterilization.  He has been consented for the procedure.  He is aware of the risks and benefits.  He had no additional questions.  He agrees to proceed.  He denies any other significant change since his last visit.  Pre-operative Diagnosis: Elective sterilization  Post-operative Diagnosis: Elective sterilization  Premedication: Valium 10 mg po  Surgeon: Vearl Aitken C. Buna Cuppett, M.D  Description: The patient was prepped and draped in the standard fashion.  The right vas deferens was identified and brought superiorly to the anterior scrotal skin.  The skin and vas was then anesthetized utilizing 7 ml 1% lidocaine.  A small stab incision was made and spread with the vas dissector.  The vas was grasped utilizing the vas clamp and elevated out of the incision.  The vas was dissected free from surrounding tissue and vessels and an ~1 cm segment was excised.  The vas lumens were cauterized utilizing electrocautery.  The distal segment was buried in the surrounding sheath with a 3-0 chromic suture.  No significant bleeding was observed.  The vas ends were then dropped back into the hemiscrotum.  The skin was closed with hemostatic pressure.  An identical procedure was performed on the contralateral side.  Clean dry gauze was applied to the incision sites.  The patient tolerated the procedure well.  Complications:None  Recommendations: 1.  No lifting greater than 10 pounds or strenuousactivity for 1 week. 2.  Scrotal support for 1 week. 3.  Shower only for 1 week; may shower in the morning 4.  May resume intercourse in one week if no significant discomfort.  Continue alternate contraception for 12 weeks.  5.  Call for significant pain, swelling, redness, drainage or fever greater than 100.5. 6.  Rx hydrocodone/APAP 5/325 1-2 every 6 hours as needed for pain. 7.  Follow-up semen analyses 6 and 12 weeks.  

## 2018-08-06 ENCOUNTER — Ambulatory Visit: Payer: Self-pay | Admitting: Surgery

## 2018-08-06 NOTE — H&P (Signed)
Subjective:   CC: Pilonidal cyst [L05.91]  HPI:  Tyler Curtis is a 32 y.o. male who was referred by Khary Stephen Carew, MD for evaluation of above. First noted several years ago.  Symptoms include: Pain is sharp, drainage noted from time to time .  Exacerbated by increased activity.  Alleviated by rest.  Associated with occasional drainage as noted above.     Past Medical History: no pertinent history  Past Surgical History:  has a past surgical history that includes Colonoscopy (08/05/2017) and egd (08/05/2017).  Family History: family history includes No Known Problems in his brother, father, mother, and sister.  Social History:  reports that he has been smoking cigarettes.  He has been smoking about 0.50 packs per day. He has never used smokeless tobacco. He reports that he does not drink alcohol or use drugs.  Current Medications: has a current medication list which includes the following prescription(s): bupropion, multivitamin, and etodolac.  Allergies:      Allergies  Allergen Reactions  . Amoxicillin Rash    ROS:  A 15 point review of systems was performed and pertinent positives and negatives noted in HPI   Objective:   BP 134/89   Pulse 99   Temp 37.1 C (98.7 F) (Oral)   Ht 182.9 cm (6')   Wt (!) 106.1 kg (234 lb)   BMI 31.74 kg/m   Constitutional :  alert, appears stated age, cooperative and no distress  Lymphatics/Throat:  supple without significant adenopathy  Respiratory:  clear to auscultation bilaterally  Cardiovascular:  regular rate and rhythm, S1, S2 normal, no murmur, click, rub or gallop  Gastrointestinal: soft, non-tender; bowel sounds normal; no masses,  no organomegaly.    Musculoskeletal: Steady gait and movement  Skin: Cool and moist.  Several non-infected pilonidal pits noted superior to gluteal cleft area, with no firm induration or raised are concerning for a cyst.  Pt does complain of mild generalized tenderness over entire area.   Some hair noted in sinus that were removed.  Psychiatric: Normal affect, non-agitated, not confused       LABS:  n/a   RADS: n/a  Assessment:      Pilonidal cyst [L05.91]  Plan:   1. Pilonidal cyst [L05.91] Discussed surgical excision.  Alternatives include continued observation.  Benefits include possible symptom relief, pathologic evaluation, improved cosmesis. Discussed the risk of surgery including recurrence, chronic pain, post-op infxn, poor cosmesis, poor/delayed wound healing, and possible re-operation to address said risks. The risks of general anesthetic, if used, includes MI, CVA, sudden death or even reaction to anesthetic medications also discussed.  Typical post-op recovery time of weeks, possible months of daily wound care were also discussed.  The patient verbalized understanding and all questions were answered to the patient's satisfaction.  2. He will like to wait until mid dec when he has some time off before proceeding with surgery.  Also will like to discuss possible flap with plastics before making final decision.  Will notify him of plastics referral when done                                                                                .       Electronically signed by Dynver Clemson, DO on 06/22/2018 3:03 PM     

## 2018-08-17 NOTE — H&P (Signed)
Subjective:   CC: Pilonidal cyst [L05.91]  HPI:  Tyler Curtis is a 32 y.o. male who was referred by Lucas Mallow, MD for evaluation of above. First noted several years ago.  Symptoms include: Pain is sharp, drainage noted from time to time .  Exacerbated by increased activity.  Alleviated by rest.  Associated with occasional drainage as noted above.     Past Medical History: no pertinent history  Past Surgical History:  has a past surgical history that includes Colonoscopy (08/05/2017) and egd (08/05/2017).  Family History: family history includes No Known Problems in his brother, father, mother, and sister.  Social History:  reports that he has been smoking cigarettes.  He has been smoking about 0.50 packs per day. He has never used smokeless tobacco. He reports that he does not drink alcohol or use drugs.  Current Medications: has a current medication list which includes the following prescription(s): bupropion, multivitamin, and etodolac.  Allergies:      Allergies  Allergen Reactions  . Amoxicillin Rash    ROS:  A 15 point review of systems was performed and pertinent positives and negatives noted in HPI   Objective:   BP 134/89   Pulse 99   Temp 37.1 C (98.7 F) (Oral)   Ht 182.9 cm (6')   Wt (!) 106.1 kg (234 lb)   BMI 31.74 kg/m   Constitutional :  alert, appears stated age, cooperative and no distress  Lymphatics/Throat:  supple without significant adenopathy  Respiratory:  clear to auscultation bilaterally  Cardiovascular:  regular rate and rhythm, S1, S2 normal, no murmur, click, rub or gallop  Gastrointestinal: soft, non-tender; bowel sounds normal; no masses,  no organomegaly.    Musculoskeletal: Steady gait and movement  Skin: Cool and moist.  Several non-infected pilonidal pits noted superior to gluteal cleft area, with no firm induration or raised are concerning for a cyst.  Pt does complain of mild generalized tenderness over entire area.   Some hair noted in sinus that were removed.  Psychiatric: Normal affect, non-agitated, not confused       LABS:  n/a   RADS: n/a  Assessment:      Pilonidal cyst [L05.91]  Plan:   1. Pilonidal cyst [L05.91] Discussed surgical excision.  Alternatives include continued observation.  Benefits include possible symptom relief, pathologic evaluation, improved cosmesis. Discussed the risk of surgery including recurrence, chronic pain, post-op infxn, poor cosmesis, poor/delayed wound healing, and possible re-operation to address said risks. The risks of general anesthetic, if used, includes MI, CVA, sudden death or even reaction to anesthetic medications also discussed.  Typical post-op recovery time of weeks, possible months of daily wound care were also discussed.  The patient verbalized understanding and all questions were answered to the patient's satisfaction.  2. He will like to wait until mid dec when he has some time off before proceeding with surgery.  Also will like to discuss possible flap with plastics before making final decision.  Will notify him of plastics referral when done                                                                                .  Electronically signed by Sung Amabile, DO on 06/22/2018 3:03 PM

## 2018-09-09 ENCOUNTER — Other Ambulatory Visit: Payer: Self-pay | Admitting: Family Medicine

## 2018-09-09 ENCOUNTER — Other Ambulatory Visit: Payer: Commercial Managed Care - PPO

## 2018-09-09 DIAGNOSIS — Z9852 Vasectomy status: Secondary | ICD-10-CM

## 2018-10-06 ENCOUNTER — Inpatient Hospital Stay: Admission: RE | Admit: 2018-10-06 | Payer: Commercial Managed Care - PPO | Source: Ambulatory Visit

## 2018-10-07 ENCOUNTER — Inpatient Hospital Stay: Admission: RE | Admit: 2018-10-07 | Payer: Commercial Managed Care - PPO | Source: Ambulatory Visit

## 2018-10-08 ENCOUNTER — Inpatient Hospital Stay: Admission: RE | Admit: 2018-10-08 | Payer: Commercial Managed Care - PPO | Source: Ambulatory Visit

## 2018-10-09 ENCOUNTER — Inpatient Hospital Stay: Admission: RE | Admit: 2018-10-09 | Payer: Commercial Managed Care - PPO | Source: Ambulatory Visit

## 2018-10-12 ENCOUNTER — Inpatient Hospital Stay: Admission: RE | Admit: 2018-10-12 | Payer: Commercial Managed Care - PPO | Source: Ambulatory Visit

## 2018-10-13 ENCOUNTER — Encounter: Admission: RE | Payer: Self-pay | Source: Ambulatory Visit

## 2018-10-13 ENCOUNTER — Ambulatory Visit: Admission: RE | Admit: 2018-10-13 | Payer: Commercial Managed Care - PPO | Source: Ambulatory Visit | Admitting: Surgery

## 2018-10-13 SURGERY — EXCISION, PILONIDAL CYST, EXTENSIVE
Anesthesia: Choice

## 2022-03-22 ENCOUNTER — Other Ambulatory Visit: Payer: Self-pay

## 2022-03-22 NOTE — Progress Notes (Signed)
Pt cleared pre-employment UDS, HR notified. 

## 2024-06-02 ENCOUNTER — Ambulatory Visit: Payer: Self-pay
